# Patient Record
Sex: Male | Born: 1985
Health system: Southern US, Community
[De-identification: ages and names within clinical notes are randomized; demographics above are authoritative.]

## PROBLEM LIST (undated history)

## (undated) DIAGNOSIS — M459 Ankylosing spondylitis of unspecified sites in spine: Secondary | ICD-10-CM

## (undated) HISTORY — DX: Ankylosing spondylitis of unspecified sites in spine: M45.9

---

## 2012-03-02 DIAGNOSIS — M459 Ankylosing spondylitis of unspecified sites in spine: Secondary | ICD-10-CM | POA: Insufficient documentation

## 2012-10-04 DIAGNOSIS — Z79899 Other long term (current) drug therapy: Secondary | ICD-10-CM | POA: Insufficient documentation

## 2015-06-06 ENCOUNTER — Encounter: Payer: Self-pay | Admitting: Family Medicine

## 2015-06-06 ENCOUNTER — Ambulatory Visit (INDEPENDENT_AMBULATORY_CARE_PROVIDER_SITE_OTHER): Payer: BLUE CROSS/BLUE SHIELD | Admitting: Family Medicine

## 2015-06-06 VITALS — BP 135/83 | HR 96 | Ht 70.0 in | Wt 293.0 lb

## 2015-06-06 DIAGNOSIS — K611 Rectal abscess: Secondary | ICD-10-CM

## 2015-06-06 DIAGNOSIS — F411 Generalized anxiety disorder: Secondary | ICD-10-CM

## 2015-06-06 DIAGNOSIS — E559 Vitamin D deficiency, unspecified: Secondary | ICD-10-CM

## 2015-06-06 DIAGNOSIS — F331 Major depressive disorder, recurrent, moderate: Secondary | ICD-10-CM

## 2015-06-06 DIAGNOSIS — E669 Obesity, unspecified: Secondary | ICD-10-CM

## 2015-06-06 DIAGNOSIS — M459 Ankylosing spondylitis of unspecified sites in spine: Secondary | ICD-10-CM

## 2015-06-06 DIAGNOSIS — F329 Major depressive disorder, single episode, unspecified: Secondary | ICD-10-CM | POA: Insufficient documentation

## 2015-06-06 MED ORDER — DOXYCYCLINE HYCLATE 100 MG PO TABS: 100.0000 mg | ORAL_TABLET | Freq: Two times a day (BID) | ORAL | Status: DC

## 2015-06-06 MED ORDER — SERTRALINE HCL 25 MG PO TABS: 25.0000 mg | ORAL_TABLET | Freq: Every day | ORAL | Status: DC

## 2015-06-06 MED ORDER — BUPROPION HCL ER (XL) 150 MG PO TB24
150.0000 mg | ORAL_TABLET | Freq: Every day | ORAL | Status: DC
Start: 2015-06-06 — End: 2015-06-30

## 2015-06-06 NOTE — Progress Notes (Signed)
Brandon Kelley is a 30 y.o. male who [name] Care today for gluteal cleft cyst, anxiety and depression, smoking cessation and obesity.  Gluteal cleft cyst: Started several weeks ago. Progressively more painful until 2 days ago when it burst and drained yellow/green fluid. No longer hurts. Has had cyst in superior gluteal cleft in the past that was popped by his girlfriend. He has soaked in hot water bath and used peroxide, but is concerned about rick of infection given proximity to feces.  Anxiety and Depression: Has struggled with feeling anxious in the past as well as depressed mood, though never formally diagnosed. He complains of decreased energy, loss of sleep, lack of appetite, decreased interest, denies SI, worrying on daily basis that also interferes with sleep. He went to a hypnotherapist last year around this time but did not feel this helped.Took his moms "SSRI" called "alprazolam" for one week in the past which "turned down some of the anxiety" but interfered with ejaculation. He is hesitant to trial another SSRI and not interested in CBT as he "does his own HW at home to deal with this". He wants to handle this so he has energy for the impending birth of his first child.   Smoking cessation: Has tried once last year with patches and was able to abstain for 3 months. He was quitting with his mom who failed which lead to him smoking cigarettes again. This is the priority change he would like to make when compared with weight loss. He has tried nicotine gum in the past but did not like it.   Ankylosing spondylitis: tolerating medicine well. Occasional flare of low back pain described as "kidney pain" given past kidney stones. Seen by rheumatology in [place].    Past Medical History  Diagnosis Date  . Ankylosing spondylitis (HCC)    History reviewed. No pertinent past  surgical history. Social History  Substance Use Topics  . Smoking status: Never Smoker   . Smokeless tobacco: Not on file  . Alcohol Use: 0.0 oz/week    0 Standard drinks or equivalent per week   family history includes Cancer in his father; Diabetes in his paternal grandmother; Hypertension in his mother.  ROS as above Medications: Current Outpatient Prescriptions  Medication Sig Dispense Refill  . etanercept (ENBREL SURECLICK) 50 MG/ML injection Inject 50 mg into the skin.    Marland Kitchen. buPROPion (WELLBUTRIN XL) 150 MG 24 hr tablet Take 1 tablet (150 mg total) by mouth daily. 30 tablet 0  . doxycycline (VIBRA-TABS) 100 MG tablet Take 1 tablet (100 mg total) by mouth 2 (two) times daily. 14 tablet 0  . sertraline (ZOLOFT) 25 MG tablet Take 1 tablet (25 mg total) by mouth daily. 30 tablet 0   No current facility-administered medications for this visit.   Allergies  Allergen Reactions  . Penicillins      Exam:  BP 135/83 mmHg  Pulse 96  Ht 5\' 10"  (1.778 m)  Wt 293 lb (132.904 kg)  BMI 42.04 kg/m2 Gen: Well appearing gentleman in NAD HEENT: EOMI,  MMM  Lungs: Normal work of breathing. CTABL no wheezes or crackles.  Heart: RRR no MRG Abd: NABS, Soft. Nondistended, Nontender Exts: Brisk capillary refill, warm and well perfused.  Buttocks: 0.5 cm erythematous lesion expressing yellow viscous fluid on left gluteal fold 0.5cm lateral to cleft, 2 cm superior to the anus. Psych: Alert and oriented flattened affect. Normal Speech. No SI or  HI.  No results found for this or any previous visit (from the past 24 hour(s)). No results found.  PHQ9: 8 GAD7: 5  Please see individual assessment and plan sections.

## 2015-06-06 NOTE — Assessment & Plan Note (Signed)
Discussed deferring formal weight loss plan until after quitting smoking. Suggested myfitnesspal for calorie counting and counseled on exercise options. Will follow up in future.

## 2015-06-06 NOTE — Patient Instructions (Signed)
Thank you for coming in today. You were seen for abscess, anxiety and depression, smoking cessation and obesity.  For the abscess, keep clean with wet wipes or wand bidet as well as warm baths. We will prescribe antibiotics to use if the pain returns, then call in for an appointment to see us.  For the anxiety and depression, we will start sertraline and bupropion. The bupropion will help with smoking cravings and sexual side effects. We will get screening labs as well. For the smoking cessation, we recommend 2 weeks of the 14 mg nicotine patch followed by 2 weeks of the 7 mg patch, followed by quitting.  Please return in 1-2 weeks to discuss lab results and see how the medications are working.

## 2015-06-06 NOTE — Assessment & Plan Note (Signed)
Mild. Prescribed sertraline. Will follow up in 1-2 weeks.

## 2015-06-06 NOTE — Assessment & Plan Note (Signed)
Likely given location and time course, release with popping. This is second abscess in this area, though past abscess seems like a pilonidal cyst. Considering possibility of fistulae given ankylosing spondylitis, though no prominent GI symptoms at this point.

## 2015-06-06 NOTE — Assessment & Plan Note (Signed)
Mild, though patients description of symptoms seen more in moderate range. Will start sertraline and bupropion given sexual side effects in past and smoking cessation need.

## 2015-06-06 NOTE — Assessment & Plan Note (Signed)
Chronic, stable. Continue follow up with Rheumatology.

## 2015-06-17 ENCOUNTER — Ambulatory Visit (INDEPENDENT_AMBULATORY_CARE_PROVIDER_SITE_OTHER): Payer: BLUE CROSS/BLUE SHIELD | Admitting: Family Medicine

## 2015-06-17 ENCOUNTER — Encounter: Payer: Self-pay | Admitting: Family Medicine

## 2015-06-17 VITALS — BP 118/82 | HR 75 | Wt 287.0 lb

## 2015-06-17 DIAGNOSIS — F411 Generalized anxiety disorder: Secondary | ICD-10-CM | POA: Diagnosis not present

## 2015-06-17 DIAGNOSIS — F331 Major depressive disorder, recurrent, moderate: Secondary | ICD-10-CM | POA: Diagnosis not present

## 2015-06-17 MED ORDER — SERTRALINE HCL 50 MG PO TABS: 50.0000 mg | ORAL_TABLET | Freq: Every day | ORAL | Status: DC

## 2015-06-17 NOTE — Assessment & Plan Note (Signed)
Increase Zoloft to 50 mg. Self titrate 200 mg in a few weeks. Recheck 1 month

## 2015-06-17 NOTE — Patient Instructions (Signed)
Thank you for coming in today. Return in 1 month.  Increase zoloft to 50 mg.  After 2 weeks increase to 100mg .

## 2015-06-17 NOTE — Progress Notes (Signed)
       Brandon Kelley is a 30 y.o. male who [name] Primary Care today for follow-up anxiety depression. Patient was seen a few weeks ago and started on bupropion and sertraline. He tolerates his medicine well with no side effects. He denies any SI or HI.   Past Medical History  Diagnosis Date  . Ankylosing spondylitis (HCC)    No past surgical history on file. Social History  Substance Use Topics  . Smoking status: Never Smoker   . Smokeless tobacco: Not on file  . Alcohol Use: 0.0 oz/week    0 Standard drinks or equivalent per week   family history includes Cancer in his father; Diabetes in his paternal grandmother; Hypertension in his mother.  ROS as above Medications: Current Outpatient Prescriptions  Medication Sig Dispense Refill  . buPROPion (WELLBUTRIN XL) 150 MG 24 hr tablet Take 1 tablet (150 mg total) by mouth daily. 30 tablet 0  . etanercept (ENBREL SURECLICK) 50 MG/ML injection Inject 50 mg into the skin.    Marland Kitchen. sertraline (ZOLOFT) 50 MG tablet Take 1 tablet (50 mg total) by mouth daily. 30 tablet 0   No current facility-administered medications for this visit.   Allergies  Allergen Reactions  . Penicillins      Exam:  BP 118/82 mmHg  Pulse 75  Wt 287 lb (130.182 kg) Gen: Well NAD  Psych: Slightly flat affect. No SI or HI. Normal speech and thought processes. PHQ9 is 3, GAD 7 is 2  No results found for this or any previous visit (from the past 24 hour(s)). No results found.   Please see individual assessment and plan sections.

## 2015-06-18 DIAGNOSIS — E559 Vitamin D deficiency, unspecified: Secondary | ICD-10-CM | POA: Insufficient documentation

## 2015-06-18 LAB — LIPID PANEL
Cholesterol: 154 mg/dL (ref 125–200)
HDL: 30 mg/dL — ABNORMAL LOW (ref >=40)
LDL CALC: 105 mg/dL (ref <130)
TRIGLYCERIDES: 97 mg/dL (ref <150)
Total CHOL/HDL Ratio: 5.1 Ratio — ABNORMAL HIGH (ref <=5.0)
VLDL: 19 mg/dL (ref <30)

## 2015-06-18 LAB — CBC
HEMATOCRIT: 49.3 % (ref 39.0–52.0)
HEMOGLOBIN: 16.9 g/dL (ref 13.0–17.0)
MCH: 29.2 pg (ref 26.0–34.0)
MCHC: 34.3 g/dL (ref 30.0–36.0)
MCV: 85.1 fL (ref 78.0–100.0)
MPV: 10 fL (ref 8.6–12.4)
Platelets: 343 10*3/uL (ref 150–400)
RBC: 5.79 MIL/uL (ref 4.22–5.81)
RDW: 13.6 % (ref 11.5–15.5)
WBC: 12.4 10*3/uL — AB (ref 4.0–10.5)

## 2015-06-18 LAB — TSH: TSH: 2.535 u[IU]/mL (ref 0.350–4.500)

## 2015-06-18 LAB — COMPREHENSIVE METABOLIC PANEL
ALT: 24 U/L (ref 9–46)
AST: 22 U/L (ref 10–40)
Albumin: 4.2 g/dL (ref 3.6–5.1)
Alkaline Phosphatase: 128 U/L — ABNORMAL HIGH (ref 40–115)
BUN: 9 mg/dL (ref 7–25)
CHLORIDE: 103 mmol/L (ref 98–110)
CO2: 26 mmol/L (ref 20–31)
CREATININE: 0.83 mg/dL (ref 0.60–1.35)
Calcium: 9.4 mg/dL (ref 8.6–10.3)
GLUCOSE: 91 mg/dL (ref 65–99)
Potassium: 4 mmol/L (ref 3.5–5.3)
SODIUM: 139 mmol/L (ref 135–146)
Total Bilirubin: 1.5 mg/dL — ABNORMAL HIGH (ref 0.2–1.2)
Total Protein: 7.1 g/dL (ref 6.1–8.1)

## 2015-06-18 LAB — VITAMIN D 25 HYDROXY (VIT D DEFICIENCY, FRACTURES): VIT D 25 HYDROXY: 7 ng/mL — AB (ref 30–100)

## 2015-06-18 MED ORDER — VITAMIN D (ERGOCALCIFEROL) 1.25 MG (50000 UNIT) PO CAPS: 50000.0000 [IU] | ORAL_CAPSULE | ORAL | Status: DC

## 2015-06-18 NOTE — Progress Notes (Signed)
Quick Note:  Vitamin D deficiency noted.  Rx for Vit D called in.   ______

## 2015-06-18 NOTE — Addendum Note (Signed)
Addended by: Rodolph Bong on: 06/18/2015 07:50 AM   Modules accepted: Orders

## 2015-06-30 ENCOUNTER — Other Ambulatory Visit: Payer: Self-pay | Admitting: Family Medicine

## 2015-07-10 ENCOUNTER — Other Ambulatory Visit: Payer: Self-pay | Admitting: Family Medicine

## 2015-07-15 ENCOUNTER — Encounter: Payer: Self-pay | Admitting: Family Medicine

## 2015-07-15 ENCOUNTER — Ambulatory Visit (INDEPENDENT_AMBULATORY_CARE_PROVIDER_SITE_OTHER): Payer: BLUE CROSS/BLUE SHIELD | Admitting: Family Medicine

## 2015-07-15 VITALS — BP 125/74 | HR 91 | Wt 281.0 lb

## 2015-07-15 DIAGNOSIS — F331 Major depressive disorder, recurrent, moderate: Secondary | ICD-10-CM | POA: Diagnosis not present

## 2015-07-15 DIAGNOSIS — H9209 Otalgia, unspecified ear: Secondary | ICD-10-CM | POA: Insufficient documentation

## 2015-07-15 DIAGNOSIS — H9202 Otalgia, left ear: Secondary | ICD-10-CM

## 2015-07-15 MED ORDER — AZITHROMYCIN 250 MG PO TABS: 250.0000 mg | ORAL_TABLET | Freq: Every day | ORAL | Status: DC

## 2015-07-15 MED ORDER — PREDNISONE 10 MG PO TABS: 30.0000 mg | ORAL_TABLET | Freq: Every day | ORAL | Status: DC

## 2015-07-15 MED ORDER — SERTRALINE HCL 100 MG PO TABS: 100.0000 mg | ORAL_TABLET | Freq: Every day | ORAL | Status: DC

## 2015-07-15 MED ORDER — BUPROPION HCL ER (XL) 150 MG PO TB24: 150.0000 mg | ORAL_TABLET | Freq: Every day | ORAL | Status: DC

## 2015-07-15 NOTE — Patient Instructions (Signed)
Thank you for coming in today. Continue the zoloft and welburtin.  Take azithromycin for ear infection. Use prednisone as needed.  Return in 3 months.  Call or go to the emergency room if you get worse, have trouble breathing, have chest pains, or palpitations.

## 2015-07-16 NOTE — Assessment & Plan Note (Signed)
Doing well. Continue Zoloft 100 mg daily and Wellbutrin 150 XR daily. Return in 3 months.

## 2015-07-16 NOTE — Progress Notes (Signed)
       Brandon Kelley is a 30 y.o. male who presents to [name]: Primary Care today for follow-up of depression and anxiety. Patient has been taking 100 mg of sertraline daily and 150 mg of bupropion XL daily. He feels well and is essentially asymptomatic. He is quite pleased with how things are going. He notes he is also reduced smoking with Wellbutrin.   Past Medical History  Diagnosis Date  . Ankylosing spondylitis (HCC)    No past surgical history on file. Social History  Substance Use Topics  . Smoking status: Never Smoker   . Smokeless tobacco: Not on file  . Alcohol Use: 0.0 oz/week    0 Standard drinks or equivalent per week   family history includes Cancer in his father; Diabetes in his paternal grandmother; Hypertension in his mother.  ROS as above Medications: Current Outpatient Prescriptions  Medication Sig Dispense Refill  . buPROPion (WELLBUTRIN XL) 150 MG 24 hr tablet Take 1 tablet (150 mg total) by mouth daily. 90 tablet 1  . etanercept (ENBREL SURECLICK) 50 MG/ML injection Inject 50 mg into the skin.    . Vitamin D, Ergocalciferol, (DRISDOL) 50000 units CAPS capsule Take 1 capsule (50,000 Units total) by mouth every 7 (seven) days. Take for 8 total doses(weeks) 8 capsule 0  . azithromycin (ZITHROMAX) 250 MG tablet Take 1 tablet (250 mg total) by mouth daily. Take first 2 tablets together, then 1 every day until finished. 6 tablet 0  . predniSONE (DELTASONE) 10 MG tablet Take 3 tablets (30 mg total) by mouth daily. 15 tablet 0  . sertraline (ZOLOFT) 100 MG tablet Take 1 tablet (100 mg total) by mouth daily. 90 tablet 1   No current facility-administered medications for this visit.   Allergies  Allergen Reactions  . Penicillins      Exam:  BP 125/74 mmHg  Pulse 91  Wt 281 lb (127.461 kg) Gen: Well NAD Psych: Alert and oriented normal speech thought process and  affect.  PHQ9 is 1.  GAD 7 is 0.  No results found for this or any previous visit (from the past 24 hour(s)). No results found.   Please see individual assessment and plan sections.

## 2015-10-14 ENCOUNTER — Encounter: Payer: Self-pay | Admitting: Family Medicine

## 2015-10-14 ENCOUNTER — Ambulatory Visit (INDEPENDENT_AMBULATORY_CARE_PROVIDER_SITE_OTHER): Payer: BLUE CROSS/BLUE SHIELD | Admitting: Family Medicine

## 2015-10-14 VITALS — BP 119/83 | HR 85 | Wt 290.0 lb

## 2015-10-14 DIAGNOSIS — R251 Tremor, unspecified: Secondary | ICD-10-CM

## 2015-10-14 NOTE — Patient Instructions (Signed)
Thank you for coming in today. Take 5000 units of vit D daily.  Return in 6 months.  Reduce caffeine. This may help your tremor.   Essential Tremor A tremor is trembling or shaking that you cannot control. Most tremors affect the hands or arms. Tremors can also affect the head, vocal cords, face, and other parts of the body.  Essential tremor is a tremor without a known cause.  CAUSES Essential tremor has no known cause.  RISK FACTORS You may be at greater risk of essential tremor if:   You have a family member with essential tremor.   You are age 29 or older.   You take certain medicines. SIGNS AND SYMPTOMS The main sign of a tremor is uncontrolled and unintentional rhythmic shaking of a body part.  You may have difficulty eating with a spoon or fork.   You may have difficulty writing.   You may nod your head up and down or side to side.   You may have a quivering voice.  Your tremors:  May get worse over time.   May come and go.   May be more noticeable on one side of your body.   May get worse due to stress, fatigue, caffeine, and extreme heat or cold.  DIAGNOSIS Your health care provider can diagnose essential tremor based on your symptoms, medical history, and a physical examination. There is no single test to diagnose an essential tremor. However, your health care provider may perform a variety of tests to rule out other conditions. Tests may include:   Blood and urine tests.   Imaging studies of your brain, such as:   CT scan.   MRI.   A test that measures involuntary muscle movement (electromyogram). TREATMENT Your tremors may go away without treatment. Mild tremors may not need treatment if they do not affect your day-to-day life. Severe tremors may need to be treated using one or a combination of the following options:   Medicines. This may include medicine that is injected.  Lifestyle changes.   Physical therapy.  HOME CARE  INSTRUCTIONS  Take medicines only as directed by your health care provider.   Limit alcohol intake to no more than 1 drink per day for nonpregnant women and 2 drinks per day for men. One drink equals 12 oz of beer, 5 oz of wine, or 1 oz of hard liquor.  Do not use any tobacco products, including cigarettes, chewing tobacco, or electronic cigarettes. If you need help quitting, ask your health care provider.  Take medicines only as directed by your health care provider.   Avoid extreme heat or cold.   Limit the amount of caffeine you consumeas directed by your health care provider.   Try to get eight hours of sleep each night.  Find ways to manage your stress, such as meditation or yoga.  Keep all follow-up visits as directed by your health care provider. This is important. This includes any physical therapy visits. SEEK MEDICAL CARE IF:  You experience any changes in the location or intensity of your tremors.   You start having a tremor after starting a new medicine.   You have tremor with other symptoms such as:   Numbness.   Tingling.   Pain.   Weakness.   Your tremor gets worse.   Your tremor interferes with your daily life.    This information is not intended to replace advice given to you by your health care provider. Make sure you discuss  any questions you have with your health care provider.   Document Released: 06/08/2014 Document Reviewed: 06/08/2014 Elsevier Interactive Patient Education [employer]2016 [employer] Inc.

## 2015-10-14 NOTE — Progress Notes (Signed)
       Brandon RileDavid Kelley is a 30 y.o. male who [name] Primary Care today for follow-up depression and anxiety. Patient has been doing quite well with sertraline and bupropion. He denies any significantly abnormal mood. He does note occasional fine tremor with fine motor tasks. This is never caused a problem. It's not present at rest. He denies any family history of tremor. He notes he reduced his caffeine intake which seemed to help some.  Additionally patient notes that he no longer is taking vitamin D at all   Past Medical History  Diagnosis Date  . Ankylosing spondylitis (HCC)    No past surgical history on file. Social History  Substance Use Topics  . Smoking status: Never Smoker   . Smokeless tobacco: Not on file  . Alcohol Use: 0.0 oz/week    0 Standard drinks or equivalent per week   family history includes Cancer in his father; Diabetes in his paternal grandmother; Hypertension in his mother.  ROS as above Medications: Current Outpatient Prescriptions  Medication Sig Dispense Refill  . buPROPion (WELLBUTRIN XL) 150 MG 24 hr tablet Take 1 tablet (150 mg total) by mouth daily. 90 tablet 1  . etanercept (ENBREL SURECLICK) 50 MG/ML injection Inject 50 mg into the skin.    Marland Kitchen. sertraline (ZOLOFT) 100 MG tablet Take 1 tablet (100 mg total) by mouth daily. 90 tablet 1  . Vitamin D, Ergocalciferol, (DRISDOL) 50000 units CAPS capsule Take 1 capsule (50,000 Units total) by mouth every 7 (seven) days. Take for 8 total doses(weeks) 8 capsule 0   No current facility-administered medications for this visit.   Allergies  Allergen Reactions  . Penicillins      Exam:  BP 119/83 mmHg  Pulse 85  Wt 290 lb (131.543 kg) Gen: Well NAD HEENT: EOMI,  MMM Lungs: Normal work of breathing. CTABL Heart: RRR no MRG Abd: NABS, Soft. Nondistended, Nontender Exts: Brisk capillary refill, warm and  well perfused.  Neuro: No tremor noted normal coordination and strength. Psych alert and oriented with normal affect. GAD 7 is 1 PHQ9 is 2.  No results found for this or any previous visit (from the past 24 hour(s)). No results found.   30 year old male with  1) depression/anxiety: Doing well continue current regimen.  2) tremor: Unclear etiology plan for watchful waiting and decrease caffeine content. Still present will consider referral to neurology.  3) vitamin D deficiency: Recommend over-the-counter vitamin D daily.   Recheck in about 6 months.

## 2016-02-14 ENCOUNTER — Other Ambulatory Visit: Payer: Self-pay | Admitting: Family Medicine

## 2016-02-15 ENCOUNTER — Other Ambulatory Visit: Payer: Self-pay | Admitting: Family Medicine

## 2016-04-19 ENCOUNTER — Encounter: Payer: Self-pay | Admitting: Emergency Medicine

## 2016-04-19 ENCOUNTER — Emergency Department
Admission: EM | Admit: 2016-04-19 | Discharge: 2016-04-19 | Disposition: A | Discharge status: Home / Self Care | Payer: BLUE CROSS/BLUE SHIELD | Source: Home / Self Care | Attending: Family Medicine | Admitting: Family Medicine

## 2016-04-19 DIAGNOSIS — S0502XA Injury of conjunctiva and corneal abrasion without foreign body, left eye, initial encounter: Secondary | ICD-10-CM

## 2016-04-19 MED ORDER — POLYMYXIN B-TRIMETHOPRIM 10000-0.1 UNIT/ML-% OP SOLN: 1.0000 [drp] | OPHTHALMIC | 0 refills | Status: DC

## 2016-04-19 MED ORDER — IBUPROFEN 600 MG PO TABS: 600.0000 mg | ORAL_TABLET | Freq: Four times a day (QID) | ORAL | 0 refills | Status: DC | PRN

## 2016-04-19 MED ORDER — HYDROCODONE-ACETAMINOPHEN 5-325 MG PO TABS: 1.0000 | ORAL_TABLET | Freq: Four times a day (QID) | ORAL | 0 refills | Status: DC | PRN

## 2016-04-19 NOTE — Discharge Instructions (Signed)
°  Norco/Vicodin (hydrocodone-acetaminophen) is a narcotic pain medication, do not combine these medications with others containing tylenol. While taking, do not drink alcohol, drive, or perform any other activities that requires focus while taking these medications.  ° °

## 2016-04-19 NOTE — ED Provider Notes (Signed)
CSN: [id number]     Arrival date & time 04/19/16  1550 History   First MD Initiated Contact with Patient 04/19/16 1606     Chief Complaint  Patient presents with  . Eye Injury   (Consider location/radiation/quality/duration/timing/severity/associated sxs/prior Treatment) HPI Brandon Kelley is a 30 y.o. male presenting to UC with c/o Left eye pain that started earlier this morning.  Pt states a USB cord feel off a shelf and hit him in his eye.  He is c/o pain and swelling in his Left eye. He has applied ice with mild relief.  He does not wear contacts or glasses.  Pain is aching and sore, 6/10.  No other injuries.    Past Medical History:  Diagnosis Date  . Ankylosing spondylitis (HCC)    History reviewed. No pertinent surgical history. Family History  Problem Relation Age of Onset  . Hypertension Mother   . Cancer Father   . Diabetes Paternal Grandmother    Social History  Substance Use Topics  . Smoking status: Never Smoker  . Smokeless tobacco: Current User  . Alcohol use 0.0 oz/week    Review of Systems  Eyes: Positive for photophobia, pain, discharge (watery), redness and visual disturbance. Negative for itching.  Skin: Negative for color change and wound.    Allergies  Penicillins  Home Medications   Prior to Admission medications   Medication Sig Start Date End Date Taking? Authorizing Provider  buPROPion (WELLBUTRIN XL) 150 MG 24 hr tablet TAKE 1 TABLET(150 MG) BY MOUTH DAILY 02/17/16   Rodolph BongEvan S Corey, MD  buPROPion (WELLBUTRIN XL) 150 MG 24 hr tablet TAKE 1 TABLET(150 MG) BY MOUTH DAILY 02/17/16   Rodolph BongEvan S Corey, MD  etanercept (ENBREL SURECLICK) 50 MG/ML injection Inject 50 mg into the skin. 12/18/13   Historical Provider, MD  HYDROcodone-acetaminophen (NORCO/VICODIN) 5-325 MG tablet Take 1 tablet by mouth every 6 (six) hours as needed. 04/19/16   Junius FinnerErin O'Malley, PA-C  ibuprofen (ADVIL,MOTRIN) 600 MG tablet Take 1 tablet (600 mg total) by mouth every 6 (six) hours as  needed. 04/19/16   Junius FinnerErin O'Malley, PA-C  sertraline (ZOLOFT) 100 MG tablet TAKE 1 TABLET(100 MG) BY MOUTH DAILY 02/17/16   Rodolph BongEvan S Corey, MD  sertraline (ZOLOFT) 100 MG tablet TAKE 1 TABLET(100 MG) BY MOUTH DAILY 02/17/16   Rodolph BongEvan S Corey, MD  trimethoprim-polymyxin b (POLYTRIM) ophthalmic solution Place 1 drop into the left eye every 4 (four) hours. For 5 days 04/19/16   Junius FinnerErin O'Malley, PA-C   Meds Ordered and Administered this Visit  Medications - No data to display  BP 126/77 (BP Location: Right Arm)   Pulse 88   Temp 97.6 F (36.4 C) (Oral)   Wt (!) 304 lb (137.9 kg)   SpO2 98%   BMI 43.62 kg/m  No data found.   Physical Exam  Constitutional: He is oriented to person, place, and time. He appears well-developed and well-nourished.  HENT:  Head: Normocephalic and atraumatic.  Eyes: EOM and lids are normal. Lids are everted and swept, no foreign bodies found. Left conjunctiva is injected.  Left eye: small amount of fluorescein uptake. No foreign bodies seen on exam.   Neck: Normal range of motion.  Cardiovascular: Normal rate.   Pulmonary/Chest: Effort normal.  Musculoskeletal: Normal range of motion.  Neurological: He is alert and oriented to person, place, and time.  Skin: Skin is warm and dry.  Psychiatric: He has a normal mood and affect. His behavior is normal.  Nursing note and  vitals reviewed.   Urgent Care Course   Clinical Course     Procedures (including critical care time)  Labs Review Labs Reviewed - No data to display  Imaging Review No results found.   Visual Acuity Review  Right Eye Distance: 20/25 Left Eye Distance: 20/40 Bilateral Distance: 20/20   MDM   1. Abrasion of left cornea, initial encounter    Pt c/o Left eye pain after being his with USB cord this morning.  Corneal abrasion noted on exam. Rx: norco, ibuprofen, and polytrim ophthalmic solution. Pt has previously established f/u with PCP tomorrow, 11/20. Encouraged f/u as scheduled.      Junius Finnerrin O'Malley, PA-C 04/20/16 1016

## 2016-04-19 NOTE — ED Triage Notes (Signed)
Pt states a USB cord fell of a shelf and hit his left eye this am. C/o pain and swelling. He does not wear contacts or glasses.

## 2016-04-20 ENCOUNTER — Encounter: Payer: Self-pay | Admitting: Family Medicine

## 2016-04-20 ENCOUNTER — Ambulatory Visit (INDEPENDENT_AMBULATORY_CARE_PROVIDER_SITE_OTHER): Payer: BLUE CROSS/BLUE SHIELD | Admitting: Family Medicine

## 2016-04-20 VITALS — BP 132/82 | HR 91 | Temp 98.0°F | Wt 306.0 lb

## 2016-04-20 DIAGNOSIS — S0502XA Injury of conjunctiva and corneal abrasion without foreign body, left eye, initial encounter: Secondary | ICD-10-CM

## 2016-04-20 DIAGNOSIS — F331 Major depressive disorder, recurrent, moderate: Secondary | ICD-10-CM

## 2016-04-20 DIAGNOSIS — J300 Vasomotor rhinitis: Secondary | ICD-10-CM | POA: Diagnosis not present

## 2016-04-20 MED ORDER — SERTRALINE HCL 100 MG PO TABS: ORAL_TABLET | ORAL | 3 refills | Status: DC

## 2016-04-20 MED ORDER — IPRATROPIUM BROMIDE 0.06 % NA SOLN: 2.0000 | NASAL | 6 refills | Status: DC | PRN

## 2016-04-20 NOTE — Patient Instructions (Signed)
Thank you for coming in today. Use atrovent spray as needed.  Continue zoloft.  Recheck in 6-12 months or sooner if needed.

## 2016-04-20 NOTE — Progress Notes (Signed)
Brandon Kelley is a 30 y.o. male who [name] today for follow-up depression, corneal abrasion, runny nose.  Depression: Doing well with Zoloft. Patient tolerates the medications well with minimal side effects. He would like to continue taking the medication.  Corneal abrasion: Patient was recently seen in urgent care for left eye corneal abrasion. He notes is feeling much better but continues to experience some mild eye pain and photophobia. He does note continuously watering left eye and running left nose.   Past Medical History:  Diagnosis Date  . Ankylosing spondylitis (HCC)    No past surgical history on file. Social History  Substance Use Topics  . Smoking status: Never Smoker  . Smokeless tobacco: Current User  . Alcohol use 0.0 oz/week   family history includes Cancer in his father; Diabetes in his paternal grandmother; Hypertension in his mother.  ROS as above:  Medications: Current Outpatient Prescriptions  Medication Sig Dispense Refill  . buPROPion (WELLBUTRIN XL) 150 MG 24 hr tablet TAKE 1 TABLET(150 MG) BY MOUTH DAILY 90 tablet 0  . etanercept (ENBREL SURECLICK) 50 MG/ML injection Inject 50 mg into the skin.    Marland Kitchen. HYDROcodone-acetaminophen (NORCO/VICODIN) 5-325 MG tablet Take 1 tablet by mouth every 6 (six) hours as needed. 8 tablet 0  . ibuprofen (ADVIL,MOTRIN) 600 MG tablet Take 1 tablet (600 mg total) by mouth every 6 (six) hours as needed. 30 tablet 0  . sertraline (ZOLOFT) 100 MG tablet TAKE 1 TABLET(100 MG) BY MOUTH DAILY 90 tablet 3  . trimethoprim-polymyxin b (POLYTRIM) ophthalmic solution Place 1 drop into the left eye every 4 (four) hours. For 5 days 10 mL 0  . ipratropium (ATROVENT) 0.06 % nasal spray Place 2 sprays into both nostrils every 4 (four) hours as needed for rhinitis. 10 mL 6   No current facility-administered  medications for this visit.    Allergies  Allergen Reactions  . Penicillins     Health Maintenance Health Maintenance  Topic Date Due  . HIV Screening  11/11/2001  . INFLUENZA VACCINE  06/05/2016 (Originally 12/31/2015)  . TETANUS/TDAP  06/05/2016 (Originally 11/11/2005)     Exam:  BP 132/82   Pulse 91   Temp 98 F (36.7 C) (Oral)   Wt (!) 306 lb (138.8 kg)   SpO2 97%   BMI 43.91 kg/m  Gen: Well NAD HEENT: EOMI,  MMM Left eye conjunctiva injection with clear discharge. Left nose has clear nasal discharge and mildly injected nasal turbinates. Lungs: Normal work of breathing. CTABL Heart: RRR no MRG Abd: NABS, Soft. Nondistended, Nontender Exts: Brisk capillary refill, warm and well perfused.  Depression screen PHQ 2/9 04/20/2016  Decreased Interest 0  Down, Depressed, Hopeless 1  PHQ - 2 Score 1  Altered sleeping 0  Tired, decreased energy 2  Change in appetite 1  Feeling bad or failure about yourself  1  Trouble concentrating 0  Moving slowly or fidgety/restless 0  Suicidal thoughts 0  PHQ-9 Score 5  Difficult doing work/chores Not difficult at all     No results found for this or any previous visit (from the past 72 hour(s)). No results found.    Assessment and Plan: 30 y.o. male with  Depression: Doing well continue current management recheck in 6-12 months.  Corneal abrasion: Doing well continue current management. Return sooner if worsening.  Runny nose likely vasomotor rhinitis due to corneal abrasion. Use Atrovent nasal spray.  No orders of the defined types were placed in this encounter.   Discussed warning signs or symptoms. Please see discharge instructions. Patient expresses understanding.

## 2016-12-21 ENCOUNTER — Ambulatory Visit: Payer: BLUE CROSS/BLUE SHIELD | Admitting: Family Medicine

## 2016-12-21 DIAGNOSIS — Z0189 Encounter for other specified special examinations: Secondary | ICD-10-CM

## 2017-05-03 ENCOUNTER — Ambulatory Visit (INDEPENDENT_AMBULATORY_CARE_PROVIDER_SITE_OTHER): Payer: BLUE CROSS/BLUE SHIELD

## 2017-05-03 ENCOUNTER — Encounter: Payer: Self-pay | Admitting: Sports Medicine

## 2017-05-03 ENCOUNTER — Ambulatory Visit (INDEPENDENT_AMBULATORY_CARE_PROVIDER_SITE_OTHER): Payer: BLUE CROSS/BLUE SHIELD | Admitting: Sports Medicine

## 2017-05-03 DIAGNOSIS — R0989 Other specified symptoms and signs involving the circulatory and respiratory systems: Secondary | ICD-10-CM | POA: Diagnosis not present

## 2017-05-03 DIAGNOSIS — J209 Acute bronchitis, unspecified: Secondary | ICD-10-CM

## 2017-05-03 DIAGNOSIS — R05 Cough: Secondary | ICD-10-CM | POA: Diagnosis not present

## 2017-05-03 MED ORDER — AZITHROMYCIN 250 MG PO TABS: ORAL_TABLET | ORAL | 0 refills | Status: DC

## 2017-05-03 MED ORDER — PREDNISONE 50 MG PO TABS: ORAL_TABLET | ORAL | 0 refills | Status: DC

## 2017-05-03 NOTE — Assessment & Plan Note (Signed)
Is now present for a week. He is relatively immunocompromised with ankylosing spondylitis and on Etanercept Because of this we are going to treat him aggressively and keep close follow-up. Azithromycin, prednisone, chest x-ray. May continue to use over-the-counter cough and cold medications. Return if not feeling great by the end of the week.

## 2017-05-03 NOTE — Progress Notes (Signed)
  Subjective:    CC: Feeling sick  HPI: This is a pleasant 31 year old male, he has a history of ankylosing spondylitis, currently on immunosuppressants.  For the past 7 days he has had cough, sore throat, muscle aches, body aches, fevers and chills.  Cough is productive of mucus, he has some facial and sinus pain and pressure as well, no nausea, vomiting, diarrhea, no skin rashes, symptoms are moderate, persistent.  Past medical history:  Negative.  See flowsheet/record as well for more information.  Surgical history: Negative.  See flowsheet/record as well for more information.  Family history: Negative.  See flowsheet/record as well for more information.  Social history: Negative.  See flowsheet/record as well for more information.  Allergies, and medications have been entered into the medical record, reviewed, and no changes needed.   Review of Systems: No fevers, chills, night sweats, weight loss, chest pain, or shortness of breath.   Objective:    General: Well Developed, well nourished, and in no acute distress.  Neuro: Alert and oriented x3, extra-ocular muscles intact, sensation grossly intact.  HEENT: Normocephalic, atraumatic, pupils equal round reactive to light, neck supple, no masses, no lymphadenopathy, thyroid nonpalpable.  Oropharynx, nasopharynx, ear canals unremarkable, no tenderness over the sinuses Skin: Warm and dry, no rashes. Cardiac: Regular rate and rhythm, no murmurs rubs or gallops, no lower extremity edema.  Respiratory: Clear to auscultation bilaterally. Not using accessory muscles, speaking in full sentences.  Impression and Recommendations:    Acute bronchitis Is now present for a week. He is relatively immunocompromised with ankylosing spondylitis and on Etanercept Because of this we are going to treat him aggressively and keep close follow-up. Azithromycin, prednisone, chest x-ray. May continue to use over-the-counter cough and cold  medications. Return if not feeling great by the end of the week. ___________________________________________ Ihor Austinhomas J. Benjamin Stainhekkekandam, M.D., ABFM., CAQSM. Primary Care and Sports Medicine [facility]  Adjunct Instructor of Family Medicine  [employer]

## 2017-06-07 ENCOUNTER — Ambulatory Visit: Payer: BLUE CROSS/BLUE SHIELD | Admitting: Family Medicine

## 2017-10-04 ENCOUNTER — Ambulatory Visit (INDEPENDENT_AMBULATORY_CARE_PROVIDER_SITE_OTHER): Payer: BLUE CROSS/BLUE SHIELD | Admitting: Family Medicine

## 2017-10-04 ENCOUNTER — Encounter: Payer: Self-pay | Admitting: Family Medicine

## 2017-10-04 VITALS — BP 125/84 | HR 68 | Wt 297.0 lb

## 2017-10-04 DIAGNOSIS — K047 Periapical abscess without sinus: Secondary | ICD-10-CM

## 2017-10-04 MED ORDER — CEFDINIR 300 MG PO CAPS: 300.0000 mg | ORAL_CAPSULE | Freq: Two times a day (BID) | ORAL | 2 refills | Status: DC

## 2017-10-04 MED ORDER — TETANUS-DIPHTH-ACELL PERTUSSIS 5-2-15.5 LF-MCG/0.5 IM SUSP: 0.5000 mL | Freq: Once | INTRAMUSCULAR | 0 refills | Status: AC

## 2017-10-04 NOTE — Patient Instructions (Addendum)
Thank you for coming in today. You have antibiotic that you can take if you start getting another abscess.  [facility] is probably your best bet for tooth extraction.  However [facility] is a cash pay local clinic.   You will continue to have abssess until the tooth fall apart completely or is removed.   Get tdap vaccine at pharmacy.      Dental Abscess A dental abscess is a collection of pus in or around a tooth. What are the causes? This condition is caused by a bacterial infection around the root of the tooth that involves the inner part of the tooth (pulp). It may result from:  Severe tooth decay.  Trauma to the tooth that allows bacteria to enter into the pulp, such as a broken or chipped tooth.  Severe gum disease around a tooth.  What are the signs or symptoms? Symptoms of this condition include:  Severe pain in and around the infected tooth.  Swelling and redness around the infected tooth, in the mouth, or in the face.  Tenderness.  Pus drainage.  Bad breath.  Bitter taste in the mouth.  Difficulty swallowing.  Difficulty opening the mouth.  Nausea.  Vomiting.  Chills.  Swollen neck glands.  Fever.  How is this diagnosed? This condition is diagnosed with examination of the infected tooth. During the exam, your dentist may tap on the infected tooth. Your dentist will also ask about your medical and dental history and may order X-rays. How is this treated? This condition is treated by eliminating the infection. This may be done with:  Antibiotic medicine.  A root canal. This may be performed to save the tooth.  Pulling (extracting) the tooth. This may also involve draining the abscess. This is done if the tooth cannot be saved.  Follow these instructions at home:  Take medicines only as directed by your dentist.  If you were prescribed antibiotic medicine, finish all of it even if you start to feel better.  Rinse your  mouth (gargle) often with salt water to relieve pain or swelling.  Do not drive or operate heavy machinery while taking pain medicine.  Do not apply heat to the outside of your mouth.  Keep all follow-up visits as directed by your dentist. This is important. Contact a health care provider if:  Your pain is worse and is not helped by medicine. Get help right away if:  You have a fever or chills.  Your symptoms suddenly get worse.  You have a very bad headache.  You have problems breathing or swallowing.  You have trouble opening your mouth.  You have swelling in your neck or around your eye. This information is not intended to replace advice given to you by your health care provider. Make sure you discuss any questions you have with your health care provider. Document Released: 05/18/2005 Document Revised: 09/26/2015 Document Reviewed: 05/15/2014 Elsevier Interactive Patient Education  [employer].

## 2017-10-04 NOTE — Progress Notes (Signed)
       Brandon Kelley is a 32 y.o. male who presents to [name]: [facility] Medicine today for dental abscesses.  Seanpatrick notes recurrent dental abscesses posterior right and left upper and lower mouth.  He is managed to get them to drain on his own at home and feeling pretty good now.  He was recently prescribed antibiotics that did not help.  His medical history is significant for ankylosing spondylitis treated with Enbrel.  He notes that he does not have dental insurance and cannot afford a multi-thousand dollars dental extraction with oral surgery.   Past Medical History:  Diagnosis Date  . Ankylosing spondylitis (HCC)    No past surgical history on file. Social History   Tobacco Use  . Smoking status: Never Smoker  . Smokeless tobacco: Current User  Substance Use Topics  . Alcohol use: Yes    Alcohol/week: 0.0 oz   family history includes Cancer in his father; Diabetes in his paternal grandmother; Hypertension in his mother.  ROS as above:  Medications: Current Outpatient Medications  Medication Sig Dispense Refill  . etanercept (ENBREL SURECLICK) 50 MG/ML injection Inject 50 mg into the skin.    . cefdinir (OMNICEF) 300 MG capsule Take 1 capsule (300 mg total) by mouth 2 (two) times daily. For dental abscess 14 capsule 2  . Tdap (ADACEL) 09-30-13.5 LF-MCG/0.5 injection Inject 0.5 mLs into the muscle once for 1 dose. 0.5 mL 0   No current facility-administered medications for this visit.    Allergies  Allergen Reactions  . Penicillins     Health Maintenance Health Maintenance  Topic Date Due  . HIV Screening  11/11/2001  . TETANUS/TDAP  11/11/2005  . INFLUENZA VACCINE  12/30/2017     Exam:  BP 125/84   Pulse 68   Wt 297 lb (134.7 kg)   BMI 42.62 kg/m  Gen: Well NAD HEENT: EOMI,  MMM significant dental caries posterior molars upper and lower right and left.  No gum  erythema or abscess swelling. Lungs: Normal work of breathing. CTABL Heart: RRR no MRG Abd: NABS, Soft. Nondistended, Nontender Exts: Brisk capillary refill, warm and well perfused.  MSK: Patient is unable to extend his cervical spine     Assessment and Plan: 32 y.o. male with  Current dental abscess is worsened by immunosuppression Enbrel closing spondylitis needed for ankylosing spondylitis.  Patient ultimately will need dental extraction.  Unfortunately this is going to be hard to arrange.  I written a letter for his health insurance asking for coverage.  Recommend patient call his health insurance company as well.  Additionally [facility] may be a good option.  Back-up Omnicef for use if he worsens again.    No orders of the defined types were placed in this encounter.  Meds ordered this encounter  Medications  . cefdinir (OMNICEF) 300 MG capsule    Sig: Take 1 capsule (300 mg total) by mouth 2 (two) times daily. For dental abscess    Dispense:  14 capsule    Refill:  2  . Tdap (ADACEL) 09-30-13.5 LF-MCG/0.5 injection    Sig: Inject 0.5 mLs into the muscle once for 1 dose.    Dispense:  0.5 mL    Refill:  0     Discussed warning signs or symptoms. Please see discharge instructions. Patient expresses understanding.

## 2018-05-08 ENCOUNTER — Encounter (HOSPITAL_BASED_OUTPATIENT_CLINIC_OR_DEPARTMENT_OTHER): Payer: Self-pay

## 2018-05-08 ENCOUNTER — Emergency Department (HOSPITAL_BASED_OUTPATIENT_CLINIC_OR_DEPARTMENT_OTHER)
Admission: EM | Admit: 2018-05-08 | Discharge: 2018-05-08 | Disposition: A | Discharge status: Home / Self Care | Payer: BLUE CROSS/BLUE SHIELD | Attending: Emergency Medicine | Admitting: Emergency Medicine

## 2018-05-08 ENCOUNTER — Other Ambulatory Visit: Payer: Self-pay

## 2018-05-08 DIAGNOSIS — Z79899 Other long term (current) drug therapy: Secondary | ICD-10-CM | POA: Insufficient documentation

## 2018-05-08 DIAGNOSIS — R0981 Nasal congestion: Secondary | ICD-10-CM | POA: Diagnosis present

## 2018-05-08 DIAGNOSIS — J111 Influenza due to unidentified influenza virus with other respiratory manifestations: Secondary | ICD-10-CM | POA: Diagnosis not present

## 2018-05-08 DIAGNOSIS — R69 Illness, unspecified: Secondary | ICD-10-CM

## 2018-05-08 MED ORDER — IBUPROFEN 400 MG PO TABS
600.0000 mg | ORAL_TABLET | Freq: Once | ORAL | Status: AC
  Administered 2018-05-08: 600 mg via ORAL
  Filled 2018-05-08: qty 1

## 2018-05-08 MED ORDER — CETIRIZINE-PSEUDOEPHEDRINE ER 5-120 MG PO TB12: 1.0000 | ORAL_TABLET | Freq: Two times a day (BID) | ORAL | 0 refills | Status: DC

## 2018-05-08 MED ORDER — OSELTAMIVIR PHOSPHATE 75 MG PO CAPS: 75.0000 mg | ORAL_CAPSULE | Freq: Two times a day (BID) | ORAL | 0 refills | Status: DC

## 2018-05-08 MED ORDER — IBUPROFEN 600 MG PO TABS: 600.0000 mg | ORAL_TABLET | Freq: Four times a day (QID) | ORAL | 0 refills | Status: DC | PRN

## 2018-05-08 MED ORDER — ACETAMINOPHEN 500 MG PO TABS: 500.0000 mg | ORAL_TABLET | Freq: Four times a day (QID) | ORAL | 0 refills | Status: DC | PRN

## 2018-05-08 MED ORDER — BENZONATATE 100 MG PO CAPS: 100.0000 mg | ORAL_CAPSULE | Freq: Three times a day (TID) | ORAL | 0 refills | Status: DC

## 2018-05-08 NOTE — ED Provider Notes (Signed)
[facility] EMERGENCY DEPARTMENT Provider Note   CSN: [id number] Arrival date & time: 05/08/18  1303     History   Chief Complaint Chief Complaint  Patient presents with  . Influenza    HPI Brandon Kelley is a 32 y.o. male with history of ankylosing spondylitis who presents with a 1 day history of sinus congestion, fever, headache, body aches, cough that began suddenly yesterday.  He reports he felt like he had a fever all night, however he did not take his temperature.  He has had some mild pain in chest when he coughs, but denies any shortness of breath.  Patient denies any abdominal pain, nausea, vomiting, neck pain, ear pain, sore throat.  HPI  Past Medical History:  Diagnosis Date  . Ankylosing spondylitis [facility])     Patient Active Problem List   Diagnosis Date Noted  . Tremor 10/14/2015  . Vitamin D deficiency 06/18/2015  . Major depression 06/06/2015  . GAD (generalized anxiety disorder) 06/06/2015  . Obese 06/06/2015  . Other long term (current) drug therapy 10/04/2012  . Ankylosing spondylitis (HCC) 03/02/2012    History reviewed. No pertinent surgical history.      Home Medications    Prior to Admission medications   Medication Sig Start Date End Date Taking? Authorizing Provider  acetaminophen (TYLENOL) 500 MG tablet Take 1 tablet (500 mg total) by mouth every 6 (six) hours as needed. 05/08/18   Braedan Meuth, Waylan Boga, PA-C  benzonatate (TESSALON) 100 MG capsule Take 1 capsule (100 mg total) by mouth every 8 (eight) hours. 05/08/18   Ziyan Hillmer, Waylan Boga, PA-C  cefdinir (OMNICEF) 300 MG capsule Take 1 capsule (300 mg total) by mouth 2 (two) times daily. For dental abscess 10/04/17   Rodolph Bong, MD  cetirizine-pseudoephedrine (ZYRTEC-D) 5-120 MG tablet Take 1 tablet by mouth 2 (two) times daily. 05/08/18   Glory Graefe, Waylan Boga, PA-C  etanercept (ENBREL SURECLICK) 50 MG/ML injection Inject 50 mg into the skin. 12/18/13   [provider]  ibuprofen  (ADVIL,MOTRIN) 600 MG tablet Take 1 tablet (600 mg total) by mouth every 6 (six) hours as needed. 05/08/18   Xavi Tomasik, Waylan Boga, PA-C  oseltamivir (TAMIFLU) 75 MG capsule Take 1 capsule (75 mg total) by mouth every 12 (twelve) hours. 05/08/18   Emi Holes, PA-C    Family History Family History  Problem Relation Age of Onset  . Hypertension Mother   . Cancer Father   . Diabetes Paternal Grandmother     Social History Social History   Tobacco Use  . Smoking status: Never Smoker  . Smokeless tobacco: Current User    Types: Snuff  Substance Use Topics  . Alcohol use: Not Currently    Alcohol/week: 0.0 standard drinks  . Drug use: No     Allergies   Penicillins   Review of Systems Review of Systems  Constitutional: Positive for chills and fever.  HENT: Positive for congestion. Negative for ear pain, facial swelling and sore throat.   Respiratory: Positive for cough. Negative for shortness of breath.   Cardiovascular: Positive for chest pain (only mild with coughing).  Gastrointestinal: Negative for abdominal pain, nausea and vomiting.  Genitourinary: Negative for dysuria.  Musculoskeletal: Negative for back pain.  Skin: Negative for rash and wound.  Neurological: Positive for headaches.  Psychiatric/Behavioral: The patient is not nervous/anxious.      Physical Exam Updated Vital Signs BP 110/82 (BP Location: Left Arm)   Pulse 84   Temp 98.9 F (  37.2 C) (Oral)   Resp 20   Ht 5\' 10"  (1.778 m)   Wt 131.5 kg   SpO2 98%   BMI 41.61 kg/m   Physical Exam  Constitutional: He appears well-developed and well-nourished. No distress.  HENT:  Head: Normocephalic and atraumatic.  Right Ear: Tympanic membrane normal.  Left Ear: Tympanic membrane normal.  Mouth/Throat: Oropharynx is clear and moist. No oropharyngeal exudate, posterior oropharyngeal edema, posterior oropharyngeal erythema or tonsillar abscesses. Tonsils are 1+ on the right. Tonsils are 1+ on the left. No  tonsillar exudate.  Eyes: Pupils are equal, round, and reactive to light. Conjunctivae are normal. Right eye exhibits no discharge. Left eye exhibits no discharge. No scleral icterus.  Neck: Normal range of motion. Neck supple. No thyromegaly present.  Cardiovascular: Normal rate, regular rhythm, normal heart sounds and intact distal pulses. Exam reveals no gallop and no friction rub.  No murmur heard. Pulmonary/Chest: Effort normal and breath sounds normal. No stridor. No respiratory distress. He has no wheezes. He has no rales.  Abdominal: Soft. Bowel sounds are normal. He exhibits no distension. There is no tenderness. There is no rebound and no guarding.  Musculoskeletal: He exhibits no edema.  Lymphadenopathy:    He has no cervical adenopathy.  Neurological: He is alert. Coordination normal.  Skin: Skin is warm and dry. No rash noted. He is not diaphoretic. No pallor.  Psychiatric: He has a normal mood and affect.  Nursing note and vitals reviewed.    ED Treatments / Results  Labs (all labs ordered are listed, but only abnormal results are displayed) Labs Reviewed  INFLUENZA PANEL BY PCR (TYPE A & B)    EKG None  Radiology No results found.  Procedures Procedures (including critical care time)  Medications Ordered in ED Medications  ibuprofen (ADVIL,MOTRIN) tablet 600 mg (600 mg Oral Given 05/08/18 1616)     Initial Impression / Assessment and Plan / ED Course  I have reviewed the triage vital signs and the nursing notes.  Pertinent labs & imaging results that were available during my care of the patient were reviewed by me and considered in my medical decision making (see chart for details).     Patient with symptoms consistent with influenza.  Vitals are stable, no fever.  No signs of dehydration, tolerating PO's.  Lungs are clear. Due to patient's presentation and physical exam a chest x-ray was not ordered bc likely diagnosis of flu.  I discussed the cost versus  benefit versus side effects of Tamiflu and patient would like to have a prescription for Tamiflu.  Influenza screening sent and pending and patient advised not to fill Tamiflu unless positive result.  He is advised to check this on MyChart.  Patient will be discharged with instructions to orally hydrate, rest, and use over-the-counter medications such as anti-inflammatories ibuprofen and Aleve for muscle aches and Tylenol for fever.  Patient will also be given a cough suppressant.  Return precautions discussed.  Patient understands and agrees with plan.  Patient vitals stable throughout ED course and discharged in satisfactory condition.   Final Clinical Impressions(s) / ED Diagnoses   Final diagnoses:  Influenza-like illness    ED Discharge Orders         Ordered    ibuprofen (ADVIL,MOTRIN) 600 MG tablet  Every 6 hours PRN     05/08/18 1623    acetaminophen (TYLENOL) 500 MG tablet  Every 6 hours PRN     05/08/18 1623    cetirizine-pseudoephedrine (ZYRTEC-D)  5-120 MG tablet  2 times daily     05/08/18 1623    benzonatate (TESSALON) 100 MG capsule  Every 8 hours     05/08/18 1623    oseltamivir (TAMIFLU) 75 MG capsule  Every 12 hours     05/08/18 1623           Emi HolesLaw, Mylinh Cragg M, PA-C 05/08/18 1625    Raeford RazorKohut, Stephen, MD 05/12/18 [phone]

## 2018-05-08 NOTE — Discharge Instructions (Signed)
Alternate ibuprofen and Tylenol as prescribed for your body aches and fever.  Take Tessalon every 8 hours as needed for cough.  Take Zyrtec-D 1-2 times daily for your congestion.  If you test positive for the flu, which can be followed up on "MyChart," begin taking Tamiflu.  Please follow-up with your doctor as needed.  Please return the emergency department develop any new or worsening symptoms.

## 2018-05-08 NOTE — ED Triage Notes (Signed)
Pt reports fever, body aches, HA, cough, and URI symptoms that started yesterday.

## 2018-05-09 LAB — INFLUENZA PANEL BY PCR (TYPE A & B)
Influenza A By PCR: POSITIVE — AB
Influenza B By PCR: NEGATIVE

## 2018-10-11 ENCOUNTER — Encounter (INDEPENDENT_AMBULATORY_CARE_PROVIDER_SITE_OTHER): Payer: Self-pay

## 2018-10-11 ENCOUNTER — Telehealth: Payer: BLUE CROSS/BLUE SHIELD | Admitting: Physician Assistant

## 2018-10-11 ENCOUNTER — Telehealth: Payer: Self-pay

## 2018-10-11 DIAGNOSIS — B349 Viral infection, unspecified: Secondary | ICD-10-CM

## 2018-10-11 NOTE — Progress Notes (Signed)
-Visit for Corona Virus Screening  Based on your current symptoms, you may very well have the virus, however your symptoms are mild. Currently, not all patients are being tested. If the symptoms are mild and there is not a known exposure, performing the test is not indicated.  You have been enrolled in MyChart Home Monitoring for COVID-19. Daily you will receive a questionnaire within the MyChart website. Our COVID-19 response team will be monitoring your responses daily.   Coronavirus disease 2019 (COVID-19)is a respiratory illness that can spread from person to person. The virus that causes COVID-19 is a new virus that was first identified in the country of [place] but is now found in multiple other countries and has spread to the [place].Symptoms associated with the virus are mild to severe fever, cough, and shortness of breath. There is currently no vaccine to protect against COVID-19, and there is no specific antiviral treatment for the virus.  To be considered HIGH RISK for Coronavirus (COVID-19), you have to meet the following criteria:  . Traveled to [place], [place], [place], [place] or [place]; or in the [place] to [place], [place], [place], or [place]; and have fever, cough, and shortness of breath within the last 2 weeks of travel OR  . Been in close contact with a person diagnosed with COVID-19 within the last 2 weeks and have fever, cough, and shortness of breath  . IF YOU DO NOT MEET THESE CRITERIA, YOU ARE CONSIDERED LOW RISK FOR COVID-19.   It is vitally important that if you feel that you have an infection such as this virus or any other virus that you stay home and away from places where you may spread it to others.You should self-quarantine for 14 days if you have symptoms that could potentially be coronavirus and avoid contact with people age 45 and older.     You may take acetaminophen (Tylenol) 1000 mg every 8 hours as needed for fever.   Reduce your  risk of any infection by using the same precautions used for avoiding the common cold or flu: Wash your hands often with soap and warm water for at least 20 seconds.If soap and water are not readily available, use an alcohol-based hand sanitizer with at least 60% alcohol.  If coughing or sneezing, cover your mouth and nose by coughing or sneezing into the elbow areas of your shirt or coat, into a tissue or into your sleeve (not your hands). Avoid shaking hands with others and consider head nods or verbal greetings only.  Avoid touching your eyes,nose, or mouth with unwashed hands. Avoid close contact with people who are sick. Avoid places or events with large numbers of people in one location, like concerts or sporting events. Carefully consider travel plans you have or are making. If you are planning any travel outside or inside the [place], visit the CDC'sTravelers' Health webpagefor the latest health notices. If you have some symptoms but not all symptoms, continue to monitor at home and seek medical attention if your symptoms worsen. If you are having a medical emergency, call 911.  HOME CARE Only take medications as instructed by your medical team. Drink plenty of fluids and get plenty of rest. A steam or ultrasonic humidifier can help if you have congestion.   GET HELP RIGHT AWAY IF: You develop worsening fever. You become short of breath You cough up blood. Your symptoms become more severe MAKE SURE YOU  Understand these instructions. Will watch your condition.  Will get help right away if you are not doing well or get worse.  Your e-visit answers were reviewed by a board certified advanced clinical practitioner to complete your personal care plan.Depending on the condition, your plan could have included both over the counter or prescription medications.If there is a problem please reply once you have received a response from your provider. Your safety is important to us.If  you have drug allergies check your prescription carefully.  You can use MyChart to ask questions about today's visit, request a non-urgent call back, or ask for a work or school excuse for 24 hours related to this e-Visit. If it has been greater than 24 hours you will need to follow up with your provider, or enter a new e-Visit to address those concerns. You will get an e-mail in the next two days asking about your experience.I hope that your e-visit has been valuable and will speed your recovery. Thank you for using e-visits.     ----- Message -----  From: Brandon Kelley  Sent: 5/12/20203:36 PM EDT  To: E-Visit Mailing List Subject: E-Visit Submission: CoronaVirus (COVID-19) Screening  E-Visit Submission: CoronaVirus (COVID-19) Screening --------------------------------  Question: Do you have any of the following?  Answer: Cough  Question: Do you have any of the following additional symptoms?  Answer: Stuffy nose  Diarrhea  Question: Have you had a fever? Answer: No  Question: Have others in your home or workplace had similar symptoms? Answer: No  Question: When did your symptoms start? Answer: Haven't yet  Question: Have you recently visited any of the following countries? Answer: None of these  Question: If you have traveled anywhere in the last2 months please document where you have visited: Answer:   Question: Have you recently been around others from these countries or visited these countries who have had coughing or fever? Answer: No  Question: Have you recently been around anyone who has been diagnosed with Corona virus? Answer: Yes  Question: Have you been taking any medications? Answer: Yes  Question: If taking medications for these symptoms, please list the names and whether they are helping or not Answer:   Question: Are you treated for any of the following conditions: Asthma, COPD, Diabetes, Renal Failure  (on Dialysis), AIDS, any Neuromuscular disease that effects the clearing of secretions, Heart Failure, or Heart Disease? Answer: No  Question: Please enter a phone number where you can be reached if we have additional questions about your symptoms Answer: [phone]  Question: Please list your medication allergies that you may have ? (If 'none' , please list as 'none') Answer: None  Question: Please list any additional comments  Answer: My sister in law was tested postive today, she spent most of the day at our home last monday.  A total of 5-10 minutes was spent evaluating this patients questionnaire and formulating a plan of care.

## 2018-10-11 NOTE — Telephone Encounter (Signed)
Called and spoke with pt about appetite and diarrhea. Pt said that he was taking Pepto Bismol that seem to be helping with stomach pain and diarrhea, told pt  to drink plenty of fluids, and eat bland diet like soup, for his appetite.  If diarrhea gets worse call his pcp . Pt stated that his temp was 99.3 .

## 2018-10-18 ENCOUNTER — Encounter: Payer: Self-pay | Admitting: Family Medicine

## 2018-10-19 ENCOUNTER — Encounter: Payer: Self-pay | Admitting: Family Medicine

## 2018-10-19 ENCOUNTER — Telehealth (INDEPENDENT_AMBULATORY_CARE_PROVIDER_SITE_OTHER): Payer: BLUE CROSS/BLUE SHIELD | Admitting: Family Medicine

## 2018-10-19 VITALS — Temp 98.0°F | Ht 70.0 in | Wt 305.0 lb

## 2018-10-19 DIAGNOSIS — F331 Major depressive disorder, recurrent, moderate: Secondary | ICD-10-CM

## 2018-10-19 DIAGNOSIS — R109 Unspecified abdominal pain: Secondary | ICD-10-CM

## 2018-10-19 DIAGNOSIS — F411 Generalized anxiety disorder: Secondary | ICD-10-CM

## 2018-10-19 MED ORDER — BUPROPION HCL ER (XL) 150 MG PO TB24: 150.0000 mg | ORAL_TABLET | ORAL | 0 refills | Status: DC

## 2018-10-19 MED ORDER — SERTRALINE HCL 100 MG PO TABS: 100.0000 mg | ORAL_TABLET | Freq: Every day | ORAL | 0 refills | Status: DC

## 2018-10-19 MED ORDER — DICYCLOMINE HCL 10 MG PO CAPS: 10.0000 mg | ORAL_CAPSULE | Freq: Three times a day (TID) | ORAL | 1 refills | Status: DC

## 2018-10-19 NOTE — Progress Notes (Signed)
Virtual Visit  via Video Note  I connected with  Brandon Rileavid Kelley by a video enabled telemedicine application and verified that I am speaking with the correct person using two identifiers.   I discussed the limitations of evaluation and management by telemedicine and the availability of in person appointments. The patient expressed understanding and agreed to proceed.  History of Present Illness: Brandon Kelley is a 33 y.o. male who would like to discuss mood worsening.  Patient notes that over the last several months has had worsening irritability.  He has a history of anxiety and depression that several years ago was well controlled with Zoloft and Wellbutrin.  He notes recently has had increased stressors.  He is a 32-year-old daughter who he primarily has to take care of.  His wife has foot problems and has trouble standing after a long day of work.  Also he is living with his mother who is demanding.  He notes this is caused increased stressors.  Since COVID-19 lockdown he also notes decreased outlets and more stressors as well.  He notes worsening irritability anxiety and insomnia.  He is interested in restarting his medication.  Additionally he notes episodes of abdominal bloating gas diarrhea and cramping.  He notes this waxes and wanes and has been ongoing for several months.  He notes it is worse when he is more anxious.    Observations/Objective: Temp 98 F (36.7 C) (Oral)   Ht 5\' 10"  (1.778 m)   Wt (!) 305 lb (138.3 kg)   BMI 43.76 kg/m  Wt Readings from Last 5 Encounters:  10/19/18 (!) 305 lb (138.3 kg)  05/08/18 290 lb (131.5 kg)  10/04/17 297 lb (134.7 kg)  05/03/17 (!) 306 lb (138.8 kg)  04/20/16 (!) 306 lb (138.8 kg)   Exam: Appearance nontoxic no acute distress Normal Speech.  Psych alert and oriented normal speech thought process and affect.  [facility]19[facility]2018 04/20/2016  Decreased Interest 1 0  Down, Depressed, Hopeless 1 1  PHQ - 2 Score 2 1   Altered sleeping 1 0  Tired, decreased energy 3 2  Change in appetite 2 1  Feeling bad or failure about yourself  2 1  Trouble concentrating 3 0  Moving slowly or fidgety/restless 0 0  Suicidal thoughts 0 0  PHQ-9 Score 13 5  Difficult doing work/chores Somewhat difficult Not difficult at all   GAD 7 : Generalized Anxiety Score 10/19/2018  Nervous, Anxious, on Edge 3  Control/stop worrying 2  Worry too much - different things 3  Trouble relaxing 2  Restless 1  Easily annoyed or irritable 3  Afraid - awful might happen 0  Total GAD 7 Score 14  Anxiety Difficulty Somewhat difficult     Lab and Radiology Results No results found for this or any previous visit (from the past 72 hour(s)). No results found.   Assessment and Plan: 33 y.o. male with  Anxiety and depression symptoms.  Worsening recently.  Plan to restart Zoloft and Wellbutrin.  Check back in about 2-1/2 weeks.  Precautions reviewed.  Plan for titration of both Zoloft and delayed start Wellbutrin to minimize side effects.  Diarrhea abdominal cramping and bloating.  Likely IBS.  After discussion plan to try dicyclomine.  Check back in 2 to 3 weeks.  If not improving from abdominal standpoint likely will make next visit in an office visit.  PDMP not reviewed this encounter. No orders of the defined types were placed in  this encounter.  No orders of the defined types were placed in this encounter.   Follow Up Instructions:    I discussed the assessment and treatment plan with the patient. The patient was provided an opportunity to ask questions and all were answered. The patient agreed with the plan and demonstrated an understanding of the instructions.   The patient was advised to call back or seek an in-person evaluation if the symptoms worsen or if the condition fails to improve as anticipated.  Time: 25 minutes of intraservice time, with >39 minutes of total time during today's visit.       Historical  information moved to improve visibility of documentation.  Past Medical History:  Diagnosis Date  . Ankylosing spondylitis (HCC)    No past surgical history on file. Social History   Tobacco Use  . Smoking status: Never Smoker  . Smokeless tobacco: Current User    Types: Snuff  Substance Use Topics  . Alcohol use: Not Currently    Alcohol/week: 0.0 standard drinks   family history includes Cancer in his father; Diabetes in his paternal grandmother; Hypertension in his mother.  Medications: Current Outpatient Medications  Medication Sig Dispense Refill  . etanercept (ENBREL SURECLICK) 50 MG/ML injection Inject 50 mg into the skin.     No current facility-administered medications for this visit.    Allergies  Allergen Reactions  . Penicillins

## 2018-10-19 NOTE — Patient Instructions (Addendum)
Thank you for coming in today. We will restart Zoloft and Wellbutrin.  Take 1/2 pill of Zoloft for 1 week and increase to full pill in 1 week.  Start Wellbutrin a few days after starting Zoloft.  If you feel weird decrease the dose of Zoloft.  Check back Monday June 8 at 2pm with me. You will get ahead of time.   Use dicyclomine up to 4 times daily as needed for abdominal cramping or diarrhea.   Irritable Bowel Syndrome, Adult  Irritable bowel syndrome (IBS) is a group of symptoms that affects the organs responsible for digestion (gastrointestinal or GI tract). IBS is not one specific disease. To regulate how the GI tract works, the body sends signals back and forth between the intestines and the brain. If you have IBS, there may be a problem with these signals. As a result, the GI tract does not function normally. The intestines may become more sensitive and overreact to certain things. This may be especially true when you eat certain foods or when you are under stress. There are four types of IBS. These may be determined based on the consistency of your stool (feces):  IBS with diarrhea.  IBS with constipation.  Mixed IBS.  Unsubtyped IBS. It is important to know which type of IBS you have. Certain treatments are more likely to be helpful for certain types of IBS. What are the causes? The exact cause of IBS is not known. What increases the risk? You may have a higher risk for IBS if you:  Are male.  Are younger than 52.  Have a family history of IBS.  Have a mental health condition, such as depression, anxiety, or post-traumatic stress disorder.  Have had a bacterial infection of your GI tract. What are the signs or symptoms? Symptoms of IBS vary from person to person. The main symptom is abdominal pain or discomfort. Other symptoms usually include one or more of the following:  Diarrhea, constipation, or both.  Abdominal swelling or bloating.  Feeling full after  eating a small or regular-sized meal.  Frequent gas.  Mucus in the stool.  A feeling of having more stool left after a bowel movement. Symptoms tend to come and go. They may be triggered by stress, mental health conditions, or certain foods. How is this diagnosed? This condition may be diagnosed based on a physical exam, your medical history, and your symptoms. You may have tests, such as:  Blood tests.  Stool test.  X-rays.  CT scan.  Colonoscopy. This is a procedure in which your GI tract is viewed with a long, thin, flexible tube. How is this treated? There is no cure for IBS, but treatment can help relieve symptoms. Treatment depends on the type of IBS you have, and may include:  Changes to your diet, such as: ? Avoiding foods that cause symptoms. ? Drinking more water. ? Following a low-FODMAP (fermentable oligosaccharides, disaccharides, monosaccharides, and polyols) diet for up to 6 weeks, or as told by your health care provider. FODMAPs are sugars that are hard for some people to digest. ? Eating more fiber. ? Eating medium-sized meals at the same times every day.  Medicines. These may include: ? Fiber supplements, if you have constipation. ? Medicine to control diarrhea (antidiarrheal medicines). ? Medicine to help control muscle tightening (spasms) in your GI tract (antispasmodic medicines). ? Medicines to help with mental health conditions, such as antidepressants or tranquilizers.  Talk therapy or counseling.  Working with a diet  and nutrition specialist (dietitian) to help create a food plan that is right for you.  Managing your stress. Follow these instructions at home: Eating and drinking  Eat a healthy diet.  Eat medium-sized meals at about the same time every day. Do not eat large meals.  Gradually eat more fiber-rich foods. These include whole grains, fruits, and vegetables. This may be especially helpful if you have IBS with constipation.  Eat a  diet low in FODMAPs.  Drink enough fluid to keep your urine pale yellow.  Keep a journal of foods that seem to trigger symptoms.  Avoid foods and drinks that: ? Contain added sugar. ? Make your symptoms worse. Dairy products, caffeinated drinks, and carbonated drinks can make symptoms worse for some people. General instructions  Take over-the-counter and prescription medicines and supplements only as told by your health care provider.  Get enough exercise. Do at least 150 minutes of moderate-intensity exercise each week.  Manage your stress. Getting enough sleep and exercise can help you manage stress.  Keep all follow-up visits as told by your health care provider and therapist. This is important. Alcohol Use  Do not drink alcohol if: ? Your health care provider tells you not to drink. ? You are pregnant, may be pregnant, or are planning to become pregnant.  If you drink alcohol, limit how much you have: ? 0-1 drink a day for women. ? 0-2 drinks a day for men.  Be aware of how much alcohol is in your drink. In the [place] one drink equals one typical bottle of beer (12 oz), one-half glass of wine (5 oz), or one shot of hard liquor (1 oz). Contact a health care provider if you have:  Constant pain.  Weight loss.  Difficulty or pain when swallowing.  Diarrhea that gets worse. Get help right away if you have:  Severe abdominal pain.  Fever.  Diarrhea with symptoms of dehydration, such as dizziness or dry mouth.  Bright red blood in your stool.  Stool that is black and tarry.  Abdominal swelling.  Vomiting that does not stop.  Blood in your vomit. Summary  Irritable bowel syndrome (IBS) is not one specific disease. It is a group of symptoms that affects digestion.  Your intestines may become more sensitive and overreact to certain things. This may be especially true when you eat certain foods or when you are under stress.  There is no cure for IBS, but  treatment can help relieve symptoms. This information is not intended to replace advice given to you by your health care provider. Make sure you discuss any questions you have with your health care provider. Document Released: 05/18/2005 Document Revised: 05/11/2017 Document Reviewed: 05/11/2017 Elsevier Interactive Patient Education  2019 Elsevier Inc.    Diet for Irritable Bowel Syndrome When you have irritable bowel syndrome (IBS), it is very important to eat the foods and follow the eating habits that are best for your condition. IBS may cause various symptoms such as pain in the abdomen, constipation, or diarrhea. Choosing the right foods can help to ease the discomfort from these symptoms. Work with your health care provider and diet and nutrition specialist (dietitian) to find the eating plan that will help to control your symptoms. What are tips for following this plan?      Keep a food diary. This will help you identify foods that cause symptoms. Write down: ? What you eat and when you eat it. ? What symptoms you have. ? When  symptoms occur in relation to your meals, such as "pain in abdomen 2 hours after dinner."  Eat your meals slowly and in a relaxed setting.  Aim to eat 5-6 small meals per day. Do not skip meals.  Drink enough fluid to keep your urine pale yellow.  Ask your health care provider if you should take an over-the-counter probiotic to help restore healthy bacteria in your gut (digestive tract). ? Probiotics are foods that contain good bacteria and yeasts.  Your dietitian may have specific dietary recommendations for you based on your symptoms. He or she may recommend that you: ? Avoid foods that cause symptoms. Talk with your dietitian about other ways to get the same nutrients that are in those problem foods. ? Avoid foods with gluten. Gluten is a protein that is found in rye, wheat, and barley. ? Eat more foods that contain soluble fiber. Examples of foods  with high soluble fiber include oats, seeds, and certain fruits and vegetables. Take a fiber supplement if directed by your dietitian. ? Reduce or avoid certain foods called FODMAPs. These are foods that contain carbohydrates that are hard to digest. Ask your doctor which foods contain these carbohydrates. What foods are not recommended? The following are some foods and drinks that may make your symptoms worse:  Fatty foods, such as french fries.  Foods that contain gluten, such as pasta and cereal.  Dairy products, such as milk, cheese, and ice cream.  Chocolate.  Alcohol.  Products with caffeine, such as coffee.  Carbonated drinks, such as soda.  Foods that are high in FODMAPs. These include certain fruits and vegetables.  Products with sweeteners such as honey, high fructose corn syrup, sorbitol, and mannitol. The items listed above may not be a complete list of foods and beverages you should avoid. Contact a dietitian for more information. What foods are good sources of fiber? Your health care provider or dietitian may recommend that you eat more foods that contain fiber. Fiber can help to reduce constipation and other IBS symptoms. Add foods with fiber to your diet a little at a time so your body can get used to them. Too much fiber at one time might cause gas and swelling of your abdomen. The following are some foods that are good sources of fiber:  Berries, such as raspberries, strawberries, and blueberries.  Tomatoes.  Carrots.  Brown rice.  Oats.  Seeds, such as chia and pumpkin seeds. The items listed above may not be a complete list of recommended sources of fiber. Contact your dietitian for more options. Where to find more information  International Foundation for Functional Gastrointestinal Disorders: www.iffgd.[employer] of Diabetes and Digestive and Kidney Diseases: Car[employer].tn Summary  When you have irritable bowel syndrome (IBS), it is  very important to eat the foods and follow the eating habits that are best for your condition.  IBS may cause various symptoms such as pain in the abdomen, constipation, or diarrhea.  Choosing the right foods can help to ease the discomfort that comes from symptoms.  Keep a food diary. This will help you identify foods that cause symptoms.  Your health care provider or diet and nutrition specialist (dietitian) may recommend that you eat more foods that contain fiber. This information is not intended to replace advice given to you by your health care provider. Make sure you discuss any questions you have with your health care provider. Document Released: 08/08/2003 Document Revised: 12/13/2017 Document Reviewed: 01/19/2017 Elsevier Interactive Patient Education  2019 Elsevier Inc.   Dicyclomine tablets or capsules What is this medicine? DICYCLOMINE (dye SYE kloe meen) is used to treat bowel problems including irritable bowel syndrome. This medicine may be used for other purposes; ask your health care provider or pharmacist if you have questions. COMMON BRAND NAME(S): Bentyl What should I tell my health care provider before I take this medicine? They need to know if you have any of these conditions: -difficulty passing urine -esophagus problems or heartburn -glaucoma -heart disease, or previous heart attack -myasthenia gravis -prostate trouble -stomach infection, or obstruction -ulcerative colitis -an unusual or allergic reaction to dicyclomine, other medicines, foods, dyes, or preservatives -pregnant or trying to get pregnant -breast-feeding How should I use this medicine? Take this medicine by mouth with a glass of water. Follow the directions on the prescription label. It is best to take this medicine on an empty stomach, 30 minutes to 1 hour before meals. Take your medicine at regular intervals. Do not take your medicine more often than directed. Talk to your pediatrician  regarding the use of this medicine in children. Special care may be needed. While this drug may be prescribed for children as young as 316 months of age for selected conditions, precautions do apply. Patients over 32 years old may have a stronger reaction and need a smaller dose. Overdosage: If you think you have taken too much of this medicine contact a poison control center or emergency room at once. NOTE: This medicine is only for you. Do not share this medicine with others. What if I miss a dose? If you miss a dose, take it as soon as you can. If it is almost time for your next dose, take only that dose. Do not take double or extra doses. What may interact with this medicine? -amantadine -antacids -benztropine -digoxin -disopyramide -medicines for allergies, colds and breathing difficulties -medicines for alzheimer's disease -medicines for anxiety or sleeping problems -medicines for depression or psychotic disturbances -medicines for diarrhea -medicines for pain -metoclopramide -tegaserod This list may not describe all possible interactions. Give your health care provider a list of all the medicines, herbs, non-prescription drugs, or dietary supplements you use. Also tell them if you smoke, drink alcohol, or use illegal drugs. Some items may interact with your medicine. What should I watch for while using this medicine? You may get drowsy, dizzy, or have blurred vision. Do not drive, use machinery, or do anything that needs mental alertness until you know how this medicine affects you. To reduce the risk of dizzy or fainting spells, do not sit or stand up quickly, especially if you are an older patient. Alcohol can make you more drowsy, avoid alcoholic drinks. Stay out of bright light and wear sunglasses if this medicine makes your eyes more sensitive to light. Avoid extreme heat (hot tubs, saunas). This medicine can cause you to sweat less than normal. Your body temperature could increase  to dangerous levels, which may lead to heat stroke. Antacids can stop this medicine from working. If you get an upset stomach and want to take an antacid, make sure there is an interval of at least 1 to 2 hours before or after you take this medicine. Your mouth may get dry. Chewing sugarless gum or sucking hard candy, and drinking plenty of water may help. Contact your doctor if the problem does not go away or is severe. What side effects may I notice from receiving this medicine? Side effects that you should report to your doctor  or health care professional as soon as possible: -agitation, nervousness, confusion -difficulty swallowing -dizziness, drowsiness -fast or slow heartbeat -hallucinations -pain or difficulty passing urine Side effects that usually do not require medical attention (report to your doctor or health care professional if they continue or are bothersome): -constipation -headache -nausea or vomiting -sexual difficulty This list may not describe all possible side effects. Call your doctor for medical advice about side effects. You may report side effects to FDA at 1-800-FDA-1088. Where should I keep my medicine? Keep out of the reach of children. Store at room temperature below 30 degrees C (86 degrees F). Protect from light. Throw away any unused medicine after the expiration date. NOTE: This sheet is a summary. It may not cover all possible information. If you have questions about this medicine, talk to your doctor, pharmacist, or health care provider.  2019 Elsevier/Gold Standard (2007-09-06 17:12:34)

## 2018-10-19 NOTE — Progress Notes (Signed)
PHQ/GAD completed. Wanting to restart Zoloft/wellbutrin. MyChart msg yesterday states: "Anxiety/depression seems to be flaring up. Life stress stuff. I've noticed it seems to present as anger sometimes. Just feeling really mad sometimes when I'm stressed. I've started getting diarrhea/ bloating/gas it comes and goes, at least once or so a week. I thought maybe it was a food allergy/intolerance at first. "

## 2018-11-07 ENCOUNTER — Telehealth (INDEPENDENT_AMBULATORY_CARE_PROVIDER_SITE_OTHER): Payer: BLUE CROSS/BLUE SHIELD | Admitting: Family Medicine

## 2018-11-07 ENCOUNTER — Encounter: Payer: Self-pay | Admitting: Family Medicine

## 2018-11-07 VITALS — Temp 98.7°F | Wt 296.4 lb

## 2018-11-07 DIAGNOSIS — R109 Unspecified abdominal pain: Secondary | ICD-10-CM

## 2018-11-07 DIAGNOSIS — F411 Generalized anxiety disorder: Secondary | ICD-10-CM

## 2018-11-07 DIAGNOSIS — Z23 Encounter for immunization: Secondary | ICD-10-CM

## 2018-11-07 DIAGNOSIS — F331 Major depressive disorder, recurrent, moderate: Secondary | ICD-10-CM

## 2018-11-07 MED ORDER — TETANUS-DIPHTH-ACELL PERTUSSIS 5-2-15.5 LF-MCG/0.5 IM SUSP: 0.5000 mL | Freq: Once | INTRAMUSCULAR | 0 refills | Status: AC

## 2018-11-07 NOTE — Progress Notes (Signed)
Virtual Visit  via Video Note  I connected with      Brandon Kelley by a video enabled telemedicine application and verified that I am speaking with the correct person using two identifiers.   I discussed the limitations of evaluation and management by telemedicine and the availability of in person appointments. The patient expressed understanding and agreed to proceed.  History of Present Illness: Brandon RileDavid Balsam is a 33 y.o. male who would like to discuss irritability.  Patient was seen on May 20 for worsening anxiety and depression symptoms.  He had developed increased stressors since COVID-19 lockdown.  In the past he had had anxiety and depression symptoms that were controlled with Zoloft and Wellbutrin.  We plan to restart both of those medications.  In the interim he notes that things are going well.  He feels a lot happy with how things are going.  He notes less anxiety and depression symptoms.  Additionally he had developed diarrhea abdominal cramping and bloating that I thought were likely due to IBS.  We will try dicyclomine.  In the interim he notes that his symptoms resolved without ever having to take the dicyclomine.   Observations/Objective: Temp 98.7 F (37.1 C) (Oral)   Wt 296 lb 6.4 oz (134.4 kg)   BMI 42.53 kg/m  Wt Readings from Last 5 Encounters:  11/07/18 296 lb 6.4 oz (134.4 kg)  10/19/18 (!) 305 lb (138.3 kg)  05/08/18 290 lb (131.5 kg)  10/04/17 297 lb (134.7 kg)  05/03/17 (!) 306 lb (138.8 kg)   Exam: Appearance nontoxic no acute distress Psych: Oriented normal speech thought process and affect.  [facility] 11/07/2018 10/19/2018 04/20/2016  Decreased Interest 1 1 0  Down, Depressed, Hopeless 1 1 1   PHQ - 2 Score 2 2 1   Altered sleeping 2 1 0  Tired, decreased energy 1 3 2   Change in appetite 0 2 1  Feeling bad or failure about yourself  1 2 1   Trouble concentrating 2 3 0  Moving slowly or fidgety/restless 0 0 0  Suicidal thoughts 0 0 0   PHQ-9 Score 8 13 5   Difficult doing work/chores Not difficult at all Somewhat difficult Not difficult at all    GAD 7 : Generalized Anxiety Score 11/07/2018 10/19/2018  Nervous, Anxious, on Edge 2 3  Control/stop worrying 1 2  Worry too much - different things 1 3  Trouble relaxing 1 2  Restless 0 1  Easily annoyed or irritable 0 3  Afraid - awful might happen 1 0  Total GAD 7 Score 6 14  Anxiety Difficulty Not difficult at all Somewhat difficult     Lab and Radiology Results No results found for this or any previous visit (from the past 72 hour(s)). No results found.   Assessment and Plan: 33 y.o. male with anxiety and depression.  Improved on medication.  Plan to continue current regimen and reassess in about 2 months or sooner if needed.  Additionally patient due for Tdap vaccine.  Will send prescription to pharmacy.  He should be able to get it there if not he can schedule nurse visit.  PDMP not reviewed this encounter. No orders of the defined types were placed in this encounter.  No orders of the defined types were placed in this encounter.   Follow Up Instructions:    I discussed the assessment and treatment plan with the patient. The patient was provided an opportunity to ask questions and all were  answered. The patient agreed with the plan and demonstrated an understanding of the instructions.   The patient was advised to call back or seek an in-person evaluation if the symptoms worsen or if the condition fails to improve as anticipated.  Time: 15 minutes of intraservice time, with >22 minutes of total time during today's visit.      Historical information moved to improve visibility of documentation.  Past Medical History:  Diagnosis Date  . Ankylosing spondylitis ([place])    No past surgical history on file. Social History   Tobacco Use  . Smoking status: Never Smoker  . Smokeless tobacco: Current User    Types: Snuff  Substance Use Topics  . Alcohol  use: Not Currently    Alcohol/week: 0.0 standard drinks   family history includes Cancer in his father; Diabetes in his paternal grandmother; Hypertension in his mother.  Medications: Current Outpatient Medications  Medication Sig Dispense Refill  . buPROPion (WELLBUTRIN XL) 150 MG 24 hr tablet Take 1 tablet (150 mg total) by mouth every morning. 90 tablet 0  . dicyclomine (BENTYL) 10 MG capsule Take 1 capsule (10 mg total) by mouth 4 (four) times daily -  before meals and at bedtime. 120 capsule 1  . etanercept (ENBREL SURECLICK) 50 MG/ML injection Inject 50 mg into the skin.    Marland Kitchen sertraline (ZOLOFT) 100 MG tablet Take 1 tablet (100 mg total) by mouth daily. 90 tablet 0   No current facility-administered medications for this visit.    Allergies  Allergen Reactions  . Penicillins

## 2019-01-09 ENCOUNTER — Telehealth (INDEPENDENT_AMBULATORY_CARE_PROVIDER_SITE_OTHER): Payer: BLUE CROSS/BLUE SHIELD | Admitting: Family Medicine

## 2019-01-09 DIAGNOSIS — F411 Generalized anxiety disorder: Secondary | ICD-10-CM | POA: Diagnosis not present

## 2019-01-09 DIAGNOSIS — F331 Major depressive disorder, recurrent, moderate: Secondary | ICD-10-CM

## 2019-01-09 MED ORDER — SERTRALINE HCL 100 MG PO TABS: 100.0000 mg | ORAL_TABLET | Freq: Every day | ORAL | 3 refills | Status: DC

## 2019-01-09 MED ORDER — BUPROPION HCL ER (XL) 150 MG PO TB24: 150.0000 mg | ORAL_TABLET | ORAL | 3 refills | Status: DC

## 2019-01-09 NOTE — Progress Notes (Signed)
Virtual Visit  via Video Note  I connected with      Brandon Kelley by a video enabled telemedicine application and verified that I am speaking with the correct person using two identifiers.   I discussed the limitations of evaluation and management by telemedicine and the availability of in person appointments. The patient expressed understanding and agreed to proceed.  History of Present Illness: Brandon Kelley is a 33 y.o. male who would like to discuss follow-up mood.  Patient was seen previously in May for worsening anxiety and depression.  He had been controlled previously on Zoloft and Wellbutrin.  These were restarted and on recheck on June 8 was doing quite well.    Observations/Objective: There were no vitals taken for this visit. Wt Readings from Last 5 Encounters:  11/07/18 296 lb 6.4 oz (134.4 kg)  10/19/18 (!) 305 lb (138.3 kg)  05/08/18 290 lb (131.5 kg)  10/04/17 297 lb (134.7 kg)  05/03/17 (!) 306 lb (138.8 kg)   Exam: Appearance Normal Speech.  Psych alert and oriented normal speech thought process and affect.  Lab and Radiology Results No results found for this or any previous visit (from the past 72 hour(s)). No results found.   Assessment and Plan: 33 y.o. male with mood: Doing quite well.  Plan to continue current regimen.  Recheck yearly or sooner if needed.  Prescription written refill sent to pharmacy.  Patient to schedule nurse visit in near future for flu and Tdap vaccine.  PDMP not reviewed this encounter. No orders of the defined types were placed in this encounter.  No orders of the defined types were placed in this encounter.   Follow Up Instructions:    I discussed the assessment and treatment plan with the patient. The patient was provided an opportunity to ask questions and all were answered. The patient agreed with the plan and demonstrated an understanding of the instructions.   The patient was advised to call back or seek an  in-person evaluation if the symptoms worsen or if the condition fails to improve as anticipated.  Time: 15 minutes of intraservice time, with >22 minutes of total time during today's visit.      Historical information moved to improve visibility of documentation.  Past Medical History:  Diagnosis Date  . Ankylosing spondylitis ([place])    No past surgical history on file. Social History   Tobacco Use  . Smoking status: Never Smoker  . Smokeless tobacco: Current User    Types: Snuff  Substance Use Topics  . Alcohol use: Not Currently    Alcohol/week: 0.0 standard drinks   family history includes Cancer in his father; Diabetes in his paternal grandmother; Hypertension in his mother.  Medications: Current Outpatient Medications  Medication Sig Dispense Refill  . buPROPion (WELLBUTRIN XL) 150 MG 24 hr tablet Take 1 tablet (150 mg total) by mouth every morning. 90 tablet 0  . etanercept (ENBREL SURECLICK) 50 MG/ML injection Inject 50 mg into the skin.    Marland Kitchen sertraline (ZOLOFT) 100 MG tablet Take 1 tablet (100 mg total) by mouth daily. 90 tablet 0   No current facility-administered medications for this visit.    Allergies  Allergen Reactions  . Penicillins

## 2019-01-10 ENCOUNTER — Telehealth: Payer: Self-pay | Admitting: Family Medicine

## 2019-01-10 NOTE — Telephone Encounter (Signed)
-----   Message from Gregor Hams, MD sent at 01/09/2019  1:33 PM EDT ----- Regarding: nurse visit Please schedule patient for nurse visit for flu shot and Tdap vaccine next month.

## 2019-01-10 NOTE — Telephone Encounter (Signed)
Left voicemail with information below. Let patient know to call us back to schedule an office visit or a virtual.  ° °

## 2019-01-11 ENCOUNTER — Encounter: Payer: Self-pay | Admitting: Family Medicine

## 2019-03-20 ENCOUNTER — Emergency Department (HOSPITAL_BASED_OUTPATIENT_CLINIC_OR_DEPARTMENT_OTHER): Payer: BLUE CROSS/BLUE SHIELD

## 2019-03-20 ENCOUNTER — Encounter (HOSPITAL_BASED_OUTPATIENT_CLINIC_OR_DEPARTMENT_OTHER): Payer: Self-pay | Admitting: *Deleted

## 2019-03-20 ENCOUNTER — Emergency Department (HOSPITAL_BASED_OUTPATIENT_CLINIC_OR_DEPARTMENT_OTHER)
Admission: EM | Admit: 2019-03-20 | Discharge: 2019-03-20 | Disposition: A | Discharge status: Home / Self Care | Payer: BLUE CROSS/BLUE SHIELD | Attending: Emergency Medicine | Admitting: Emergency Medicine

## 2019-03-20 ENCOUNTER — Other Ambulatory Visit: Payer: Self-pay

## 2019-03-20 DIAGNOSIS — Z79899 Other long term (current) drug therapy: Secondary | ICD-10-CM | POA: Diagnosis not present

## 2019-03-20 DIAGNOSIS — N200 Calculus of kidney: Secondary | ICD-10-CM | POA: Diagnosis not present

## 2019-03-20 DIAGNOSIS — Z720 Tobacco use: Secondary | ICD-10-CM | POA: Insufficient documentation

## 2019-03-20 DIAGNOSIS — R109 Unspecified abdominal pain: Secondary | ICD-10-CM | POA: Diagnosis present

## 2019-03-20 LAB — COMPREHENSIVE METABOLIC PANEL
ALT: 30 U/L (ref 0–44)
AST: 24 U/L (ref 15–41)
Albumin: 4.2 g/dL (ref 3.5–5.0)
Alkaline Phosphatase: 114 U/L (ref 38–126)
Anion gap: 11 (ref 5–15)
BUN: 11 mg/dL (ref 6–20)
CO2: 24 mmol/L (ref 22–32)
Calcium: 9 mg/dL (ref 8.9–10.3)
Chloride: 100 mmol/L (ref 98–111)
Creatinine, Ser: 0.86 mg/dL (ref 0.61–1.24)
GFR calc Af Amer: >60 mL/min (ref >60)
GFR calc non Af Amer: >60 mL/min (ref >60)
Glucose, Bld: 186 mg/dL — ABNORMAL HIGH (ref 70–99)
Potassium: 4 mmol/L (ref 3.5–5.1)
Sodium: 135 mmol/L (ref 135–145)
Total Bilirubin: 1.2 mg/dL (ref 0.3–1.2)
Total Protein: 7.9 g/dL (ref 6.5–8.1)

## 2019-03-20 LAB — CBC
HCT: 49.1 % (ref 39.0–52.0)
Hemoglobin: 16.4 g/dL (ref 13.0–17.0)
MCH: 28.2 pg (ref 26.0–34.0)
MCHC: 33.4 g/dL (ref 30.0–36.0)
MCV: 84.4 fL (ref 80.0–100.0)
Platelets: 310 10*3/uL (ref 150–400)
RBC: 5.82 MIL/uL — ABNORMAL HIGH (ref 4.22–5.81)
RDW: 12.5 % (ref 11.5–15.5)
WBC: 10.5 10*3/uL (ref 4.0–10.5)
nRBC: 0 % (ref 0.0–0.2)

## 2019-03-20 LAB — URINALYSIS, MICROSCOPIC (REFLEX): RBC / HPF: >50 RBC/hpf (ref 0–5)

## 2019-03-20 LAB — URINALYSIS, ROUTINE W REFLEX MICROSCOPIC
Glucose, UA: NEGATIVE mg/dL
Ketones, ur: NEGATIVE mg/dL
Leukocytes,Ua: NEGATIVE
Nitrite: NEGATIVE
Protein, ur: 30 mg/dL — AB
Specific Gravity, Urine: >1.03 — ABNORMAL HIGH (ref 1.005–1.030)
pH: 6 (ref 5.0–8.0)

## 2019-03-20 LAB — LIPASE, BLOOD: Lipase: 34 U/L (ref 11–51)

## 2019-03-20 MED ORDER — SODIUM CHLORIDE 0.9 % IV BOLUS
1000.0000 mL | Freq: Once | INTRAVENOUS | Status: AC
  Administered 2019-03-20: 1000 mL via INTRAVENOUS

## 2019-03-20 MED ORDER — OXYCODONE-ACETAMINOPHEN 5-325 MG PO TABS: 1.0000 | ORAL_TABLET | Freq: Four times a day (QID) | ORAL | 0 refills | Status: DC | PRN

## 2019-03-20 MED ORDER — ONDANSETRON HCL 4 MG/2ML IJ SOLN
4.0000 mg | Freq: Once | INTRAMUSCULAR | Status: AC | PRN
  Administered 2019-03-20: 4 mg via INTRAVENOUS
  Filled 2019-03-20: qty 2

## 2019-03-20 MED ORDER — ONDANSETRON 4 MG PO TBDP: 4.0000 mg | ORAL_TABLET | ORAL | 0 refills | Status: DC | PRN

## 2019-03-20 MED ORDER — TAMSULOSIN HCL 0.4 MG PO CAPS
0.4000 mg | ORAL_CAPSULE | Freq: Once | ORAL | Status: AC
  Administered 2019-03-20: 0.4 mg via ORAL
  Filled 2019-03-20: qty 1

## 2019-03-20 MED ORDER — KETOROLAC TROMETHAMINE 30 MG/ML IJ SOLN
30.0000 mg | Freq: Once | INTRAMUSCULAR | Status: AC
  Administered 2019-03-20: 13:00:00 30 mg via INTRAVENOUS
  Filled 2019-03-20: qty 1

## 2019-03-20 MED ORDER — HYDROMORPHONE HCL 1 MG/ML IJ SOLN
1.0000 mg | Freq: Once | INTRAMUSCULAR | Status: AC
Start: 2019-03-20 — End: 2019-03-20
  Administered 2019-03-20: 1 mg via INTRAVENOUS
  Filled 2019-03-20: qty 1

## 2019-03-20 MED ORDER — ONDANSETRON HCL 4 MG/2ML IJ SOLN
4.0000 mg | Freq: Once | INTRAMUSCULAR | Status: AC
  Administered 2019-03-20: 4 mg via INTRAVENOUS
  Filled 2019-03-20: qty 2

## 2019-03-20 MED ORDER — OXYCODONE-ACETAMINOPHEN 5-325 MG PO TABS
2.0000 | ORAL_TABLET | Freq: Once | ORAL | Status: AC
  Administered 2019-03-20: 15:00:00 2 via ORAL
  Filled 2019-03-20: qty 2

## 2019-03-20 MED ORDER — TAMSULOSIN HCL 0.4 MG PO CAPS: 0.4000 mg | ORAL_CAPSULE | Freq: Every day | ORAL | 0 refills | Status: DC

## 2019-03-20 MED ORDER — IBUPROFEN 800 MG PO TABS: 800.0000 mg | ORAL_TABLET | Freq: Three times a day (TID) | ORAL | 0 refills | Status: AC

## 2019-03-20 NOTE — ED Triage Notes (Addendum)
Woke with right flank pain. Nausea and vomiting. Diarrhea this am. He is being treated for an inner ear infection for a week.

## 2019-03-20 NOTE — Discharge Instructions (Signed)
1.  You have a kidney stone on the right.  It is 2 mm.  It is a small enough to pass on its own.  Take ibuprofen every 8 hours for the next 2 days for pain control.  Take Percocet if needed for additional pain control.  Take Zofran to control nausea.  Take tamsulosin daily as prescribed.  Strain your urine. 2.  If you are having continuing or worsening pain that cannot be managed at home, if you develop fever, vomiting or other concerning symptoms return to the emergency department. 3.  Call [facility] to schedule a follow-up appointment. 4.  You have incidental findings on your CT scan that are unrelated to your kidney stone.  This needs to be reviewed by your family doctor and rechecked.  You may need a repeat CT scan or other testing.  Please follow-up with your doctor and have them review your results as soon as possible.

## 2019-03-20 NOTE — ED Provider Notes (Signed)
[facility] EMERGENCY DEPARTMENT Provider Note   CSN: [id number] Arrival date & time: 03/20/19  1042     History   Chief Complaint Chief Complaint  Patient presents with   Flank Pain    HPI Brandon Kelley is a 33 y.o. male.     HPI Patient reports he went to bed feeling well.  He awakened in the early morning hours to go the bathroom.  He reports he started noticed that he was getting some pain in his right kidney.  He reports that it really escalated after that.  He reports the pain is severe and he cannot get comfortable.  He reports it does radiate towards the right testicle.  He reports he has had several kidney stones in the past but it has never been this severe.  He has been vomiting due to pain.  He did not have any pre-existing fever, chills, cough, shortness of breath.  No pain burning urgency with urination leading up to this. Past Medical History:  Diagnosis Date   Ankylosing spondylitis [facility])     Patient Active Problem List   Diagnosis Date Noted   Tremor 10/14/2015   Vitamin D deficiency 06/18/2015   Major depression 06/06/2015   GAD (generalized anxiety disorder) 06/06/2015   Obese 06/06/2015   Other long term (current) drug therapy 10/04/2012   Ankylosing spondylitis (HCC) 03/02/2012    No past surgical history on file.      Home Medications    Prior to Admission medications   Medication Sig Start Date End Date Taking? Authorizing Provider  buPROPion (WELLBUTRIN XL) 150 MG 24 hr tablet Take 1 tablet (150 mg total) by mouth every morning. 01/09/19  Yes Rodolph Bong, MD  cephALEXin (KEFLEX) 500 MG capsule Take 500 mg by mouth 4 (four) times daily.   Yes [provider]  etanercept (ENBREL SURECLICK) 50 MG/ML injection Inject 50 mg into the skin. 12/18/13  Yes [provider]  sertraline (ZOLOFT) 100 MG tablet Take 1 tablet (100 mg total) by mouth daily. 01/09/19  Yes Rodolph Bong, MD  ibuprofen (ADVIL) 800 MG tablet  Take 1 tablet (800 mg total) by mouth 3 (three) times daily. 03/20/19   Arby Barrette, MD  ondansetron (ZOFRAN ODT) 4 MG disintegrating tablet Take 1 tablet (4 mg total) by mouth every 4 (four) hours as needed for nausea or vomiting. 03/20/19   Arby Barrette, MD  oxyCODONE-acetaminophen (PERCOCET) 5-325 MG tablet Take 1-2 tablets by mouth every 6 (six) hours as needed. 03/20/19   Arby Barrette, MD  tamsulosin (FLOMAX) 0.4 MG CAPS capsule Take 1 capsule (0.4 mg total) by mouth daily. 03/20/19   Arby Barrette, MD    Family History Family History  Problem Relation Age of Onset   Hypertension Mother    Cancer Father    Diabetes Paternal Grandmother     Social History Social History   Tobacco Use   Smoking status: Never Smoker   Smokeless tobacco: Current User    Types: Snuff  Substance Use Topics   Alcohol use: Not Currently    Alcohol/week: 0.0 standard drinks   Drug use: No     Allergies   Penicillins   Review of Systems Review of Systems 10 Systems reviewed and are negative for acute change except as noted in the HPI.   Physical Exam Updated Vital Signs BP 115/76    Pulse 76    Temp 97.7 F (36.5 C) (Oral)    Resp 18  Ht 6' (1.829 m)    Wt 136.1 kg    SpO2 91%    BMI 40.69 kg/m   Physical Exam Constitutional:      Comments: Patient is alert and appropriate.  Nontoxic.  He does appear to be in pain and is very nauseated.  HENT:     Head: Normocephalic and atraumatic.  Eyes:     Extraocular Movements: Extraocular movements intact.  Cardiovascular:     Rate and Rhythm: Normal rate and regular rhythm.  Pulmonary:     Effort: Pulmonary effort is normal.     Breath sounds: Normal breath sounds.  Abdominal:     General: There is no distension.     Palpations: Abdomen is soft.     Tenderness: There is no abdominal tenderness. There is no guarding.  Musculoskeletal: Normal range of motion.  Skin:    General: Skin is warm.     Comments: Patient is  slightly diaphoretic.  Neurological:     General: No focal deficit present.     Mental Status: He is oriented to person, place, and time.     Coordination: Coordination normal.  Psychiatric:        Mood and Affect: Mood normal.      ED Treatments / Results  Labs (all labs ordered are listed, but only abnormal results are displayed) Labs Reviewed  URINALYSIS, ROUTINE W REFLEX MICROSCOPIC - Abnormal; Notable for the following components:      Result Value   Color, Urine AMBER (*)    APPearance CLOUDY (*)    Specific Gravity, Urine >1.030 (*)    Hgb urine dipstick LARGE (*)    Bilirubin Urine SMALL (*)    Protein, ur 30 (*)    All other components within normal limits  COMPREHENSIVE METABOLIC PANEL - Abnormal; Notable for the following components:   Glucose, Bld 186 (*)    All other components within normal limits  CBC - Abnormal; Notable for the following components:   RBC 5.82 (*)    All other components within normal limits  URINALYSIS, MICROSCOPIC (REFLEX) - Abnormal; Notable for the following components:   Bacteria, UA RARE (*)    All other components within normal limits  LIPASE, BLOOD    EKG None  Radiology Ct Renal Stone Study  Result Date: 03/20/2019 CLINICAL DATA:  Right flank pain with nausea and vomiting EXAM: CT ABDOMEN AND PELVIS WITHOUT CONTRAST TECHNIQUE: Multidetector CT imaging of the abdomen and pelvis was performed following the standard protocol without oral or IV contrast. COMPARISON:  None. FINDINGS: Lower chest: On axial slice 1 series 4, there is a 2 mm nodular opacity in the medial segment of the right middle lobe. The lung bases otherwise are clear. Hepatobiliary: There are some occasional probable cysts within the liver, largest measuring approximately 1 cm. Liver otherwise appears unremarkable on this noncontrast enhanced study. There is a small gallstone within the gallbladder. The gallbladder wall is not appreciably thickened. There is no biliary  duct dilatation. Pancreas: There is no pancreatic mass or inflammatory focus. Spleen: No splenic lesions are evident. Adrenals/Urinary Tract: Adrenals bilaterally appear normal. There is no appreciable renal mass on either side. There is moderate hydronephrosis on the right. There is no appreciable hydronephrosis on the left. There are occasional tiny calculi in each kidney. There is a 2 mm calculus in the right ureter at the L3-4 level. No other ureteral calculi are evident on either side. Urinary bladder is midline. The urinary bladder wall thickness  is within normal limits for nearly empty state. Stomach/Bowel: There is no appreciable bowel wall or mesenteric thickening. There is no evident bowel obstruction. Terminal ileum appears unremarkable. There is lipomatous infiltration in the ileocecal valve. There is no free air or portal venous air. Vascular/Lymphatic: There is no abdominal aortic aneurysm. There is minimal calcification in the distal aorta. There is no adenopathy in the abdomen or pelvis by size criteria. There are subcentimeter mesenteric lymph nodes which are regarded as nonspecific. Reproductive: There is a small calcification in the leftward aspect of the prostate inferiorly. Prostate and seminal vesicles are normal in size and contour. There is no evident pelvic mass. Other: The appendix appears normal. There is mild sclerosis in the midline and slightly to the left of the midline in the mid abdominal region spanning an area of 8.3 x 4.7 x 6.5 cm. A few subcentimeter lymph nodes are noted in this region of suspected sclerosing mesenteritis. There is no abscess or ascites in the abdomen or pelvis. Musculoskeletal: There is diffuse syndesmophytic change throughout the visualized spine. There is symmetric sacroiliitis bilaterally. No fracture or seen. No blastic or lytic bone lesions. There are no intramuscular or abdominal wall lesions. IMPRESSION: 1. There is a 2 mm calculus in the right ureter at  the L3-4 level moderate hydronephrosis on the right. Tiny calculi are noted in each kidney, nonobstructing. 2. Mesenteric sclerosis slightly to the left of midline in the mid abdomen. 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 This is a finding that may warrant a follow-up CT in approximately 6 months to assess for stability or regression. Atypical neoplasm conceivably could present in this manner, although the appearance suggests benign sclerosing mesenteritis. 3. Scattered subcentimeter lymph nodes in the abdomen. No frank adenopathy by size criteria in the abdomen or pelvis. These lymph nodes must be considered nonspecific given their size. 4. Symmetric bilateral sacroiliitis and syndesmophytes throughout the visualized spine. This appearance is indicative of ankylosing spondylitis. No fracture evident. 5. No bowel obstruction. No abscess in the abdomen or pelvis. Appendix appears normal. 6. 2 mm nodular opacity in the right middle lobe. In this age group, this finding is almost certainly benign. Unless patient has a history of neoplasm, further surveillance of this small nodular lesion is not felt to be necessary. Electronically Signed   By: Lowella Grip III M.D.   On: 03/20/2019 12:37    Procedures Procedures (including critical care time)  Medications Ordered in ED Medications  ondansetron (ZOFRAN) injection 4 mg (4 mg Intravenous Given 03/20/19 1133)  HYDROmorphone (DILAUDID) injection 1 mg (1 mg Intravenous Given 03/20/19 1232)  ondansetron (ZOFRAN) injection 4 mg (4 mg Intravenous Given 03/20/19 1232)  ketorolac (TORADOL) 30 MG/ML injection 30 mg (30 mg Intravenous Given 03/20/19 1232)  sodium chloride 0.9 % bolus 1,000 mL ( Intravenous Stopped 03/20/19 1339)  oxyCODONE-acetaminophen (PERCOCET/ROXICET) 5-325 MG per tablet 2 tablet (2 tablets Oral Given 03/20/19 1456)  tamsulosin (FLOMAX) capsule 0.4 mg (0.4 mg Oral Given 03/20/19 1456)     Initial Impression / Assessment and Plan / ED Course    I have reviewed the triage vital signs and the nursing notes.  Pertinent labs & imaging results that were available during my care of the patient were reviewed by me and considered in my medical decision making (see chart for details).       Patient's pain was well controlled with Dilaudid 1 mg, Toradol and Zofran.  Kidney stone is 2 mm.  He does have hydronephrosis.  No signs of  associated UTI.  Will have the patient start ibuprofen, Percocet, Zofran and tamsulosin and strain urine.  Return precautions reviewed.  Patient has incidental findings of mesenteric sclerosis.  No sign this is contributing to the patient's acute symptoms.  Review of systems does not indicate any ongoing problems.  He does have ankylosing spondylitis history.  Patient is counseled on the need to follow-up with his PCP regarding CT findings which needs surveillance.  Final Clinical Impressions(s) / ED Diagnoses   Final diagnoses:  Kidney stone    ED Discharge Orders         Ordered    oxyCODONE-acetaminophen (PERCOCET) 5-325 MG tablet  Every 6 hours PRN     03/20/19 1454    tamsulosin (FLOMAX) 0.4 MG CAPS capsule  Daily     03/20/19 1454    ibuprofen (ADVIL) 800 MG tablet  3 times daily     03/20/19 1454    ondansetron (ZOFRAN ODT) 4 MG disintegrating tablet  Every 4 hours PRN     03/20/19 1454           Arby Barrette, MD 03/20/19 1502

## 2019-05-04 ENCOUNTER — Telehealth: Payer: BLUE CROSS/BLUE SHIELD | Admitting: Physician Assistant

## 2019-05-04 DIAGNOSIS — R059 Cough, unspecified: Secondary | ICD-10-CM

## 2019-05-04 DIAGNOSIS — R05 Cough: Secondary | ICD-10-CM

## 2019-05-04 DIAGNOSIS — Z20822 Contact with and (suspected) exposure to covid-19: Secondary | ICD-10-CM

## 2019-05-04 MED ORDER — BENZONATATE 100 MG PO CAPS: 100.0000 mg | ORAL_CAPSULE | Freq: Three times a day (TID) | ORAL | 0 refills | Status: DC | PRN

## 2019-05-04 NOTE — Progress Notes (Signed)
E-Visit for Corona Virus Screening   Your current symptoms could be consistent with the coronavirus.  Many health care providers can now test patients at their office but not all are.  [place] has multiple testing sites. For information on our COVID testing locations and hours go to [email or web address]  Please quarantine yourself while awaiting your test results.  We are enrolling you in our [facility] [place] . Daily you will receive a questionnaire within the [place] website. Our COVID 19 response team willl be monitoriing your responses daily. Please continue good preventive care measures, including:  frequent hand-washing, avoid touching your face, cover coughs/sneezes, stay out of crowds and keep a 6 foot distance from others.    COVID-19 is a respiratory illness with symptoms that are similar to the flu. Symptoms are typically mild to moderate, but there have been cases of severe illness and death due to the virus. The following symptoms may appear 2-14 days after exposure: . Fever . Cough . Shortness of breath or difficulty breathing . Chills . Repeated shaking with chills . Muscle pain . Headache . Sore throat . New loss of taste or smell . Fatigue . Congestion or runny nose . Nausea or vomiting . Diarrhea  If you develop fever/cough/breathlessness, please stay home for 10 days with improving symptoms and until you have had 24 hours of no fever (without taking a fever reducer).  Go to the nearest hospital ED for assessment if fever/cough/breathlessness are severe or illness seems like a threat to life.  It is vitally important that if you feel that you have an infection such as this virus or any other virus that you stay home and away from places where you may spread it to others.  You should avoid contact with people age 12 and older.   You should wear a mask or cloth face covering over your nose and mouth if you must be around other  people or animals, including pets (even at home). Try to stay at least 6 feet away from other people. This will protect the people around you.  You can use medication such as A prescription cough medication called Tessalon Perles 100 mg. You may take 1-2 capsules every 8 hours as needed for cough. I have prescribed this medication to your pharmacy.   You may also take acetaminophen (Tylenol) as needed for fever. You can take 1 to 2 tablets of Tylenol (350mg -1000mg  depending on the dose) every 6 hours as needed for pain or fever.  Do not exceed 4000 mg of Tylenol daily.    Reduce your risk of any infection by using the same precautions used for avoiding the common cold or flu:  Marland Kitchen Wash your hands often with soap and warm water for at least 20 seconds.  If soap and water are not readily available, use an alcohol-based hand sanitizer with at least 60% alcohol.  . If coughing or sneezing, cover your mouth and nose by coughing or sneezing into the elbow areas of your shirt or coat, into a tissue or into your sleeve (not your hands). . Avoid shaking hands with others and consider head nods or verbal greetings only. . Avoid touching your eyes, nose, or mouth with unwashed hands.  . Avoid close contact with people who are sick. . Avoid places or events with large numbers of people in one location, like concerts or sporting events. . Carefully consider travel plans you have or are making. . If you are planning any  travel outside or inside the [place], visit the CDC's Travelers' Health webpage for the latest health notices. . If you have some symptoms but not all symptoms, continue to monitor at home and seek medical attention if your symptoms worsen. . If you are having a medical emergency, call 911.  HOME CARE . Only take medications as instructed by your medical team. . Drink plenty of fluids and get plenty of rest. . A steam or ultrasonic humidifier can help if you have congestion.   GET HELP RIGHT AWAY  IF YOU HAVE EMERGENCY WARNING SIGNS** FOR COVID-19. If you or someone is showing any of these signs seek emergency medical care immediately. Call 911 or proceed to your closest emergency facility if: . You develop worsening high fever. . Trouble breathing . Bluish lips or face . Persistent pain or pressure in the chest . New confusion . Inability to wake or stay awake . You cough up blood. . Your symptoms become more severe  **This list is not all possible symptoms. Contact your medical provider for any symptoms that are sever or concerning to you.   MAKE SURE YOU   Understand these instructions.  Will watch your condition.  Will get help right away if you are not doing well or get worse.  Your e-visit answers were reviewed by a board certified advanced clinical practitioner to complete your personal care plan.  Depending on the condition, your plan could have included both over the counter or prescription medications.  If there is a problem please reply once you have received a response from your provider.  Your safety is important to [place].  If you have drug allergies check your prescription carefully.    You can use MyChart to ask questions about today's visit, request a non-urgent call back, or ask for a work or school excuse for 24 hours related to this e-Visit. If it has been greater than 24 hours you will need to follow up with your provider, or enter a new e-Visit to address those concerns. You will get an e-mail in the next two days asking about your experience.  I hope that your e-visit has been valuable and will speed your recovery. Thank you for using e-visits.   Greater than 5 minutes, yet less than 10 minutes of time have been spent researching, coordinating, and implementing care for this patient today.

## 2019-05-05 IMAGING — DX DG CHEST 2V
2 series · 2 of 2 positions shown · non-contrast
Comparison: None.

CLINICAL DATA: Cough and congestion

EXAM:
CHEST  2 VIEW

[chest pa]
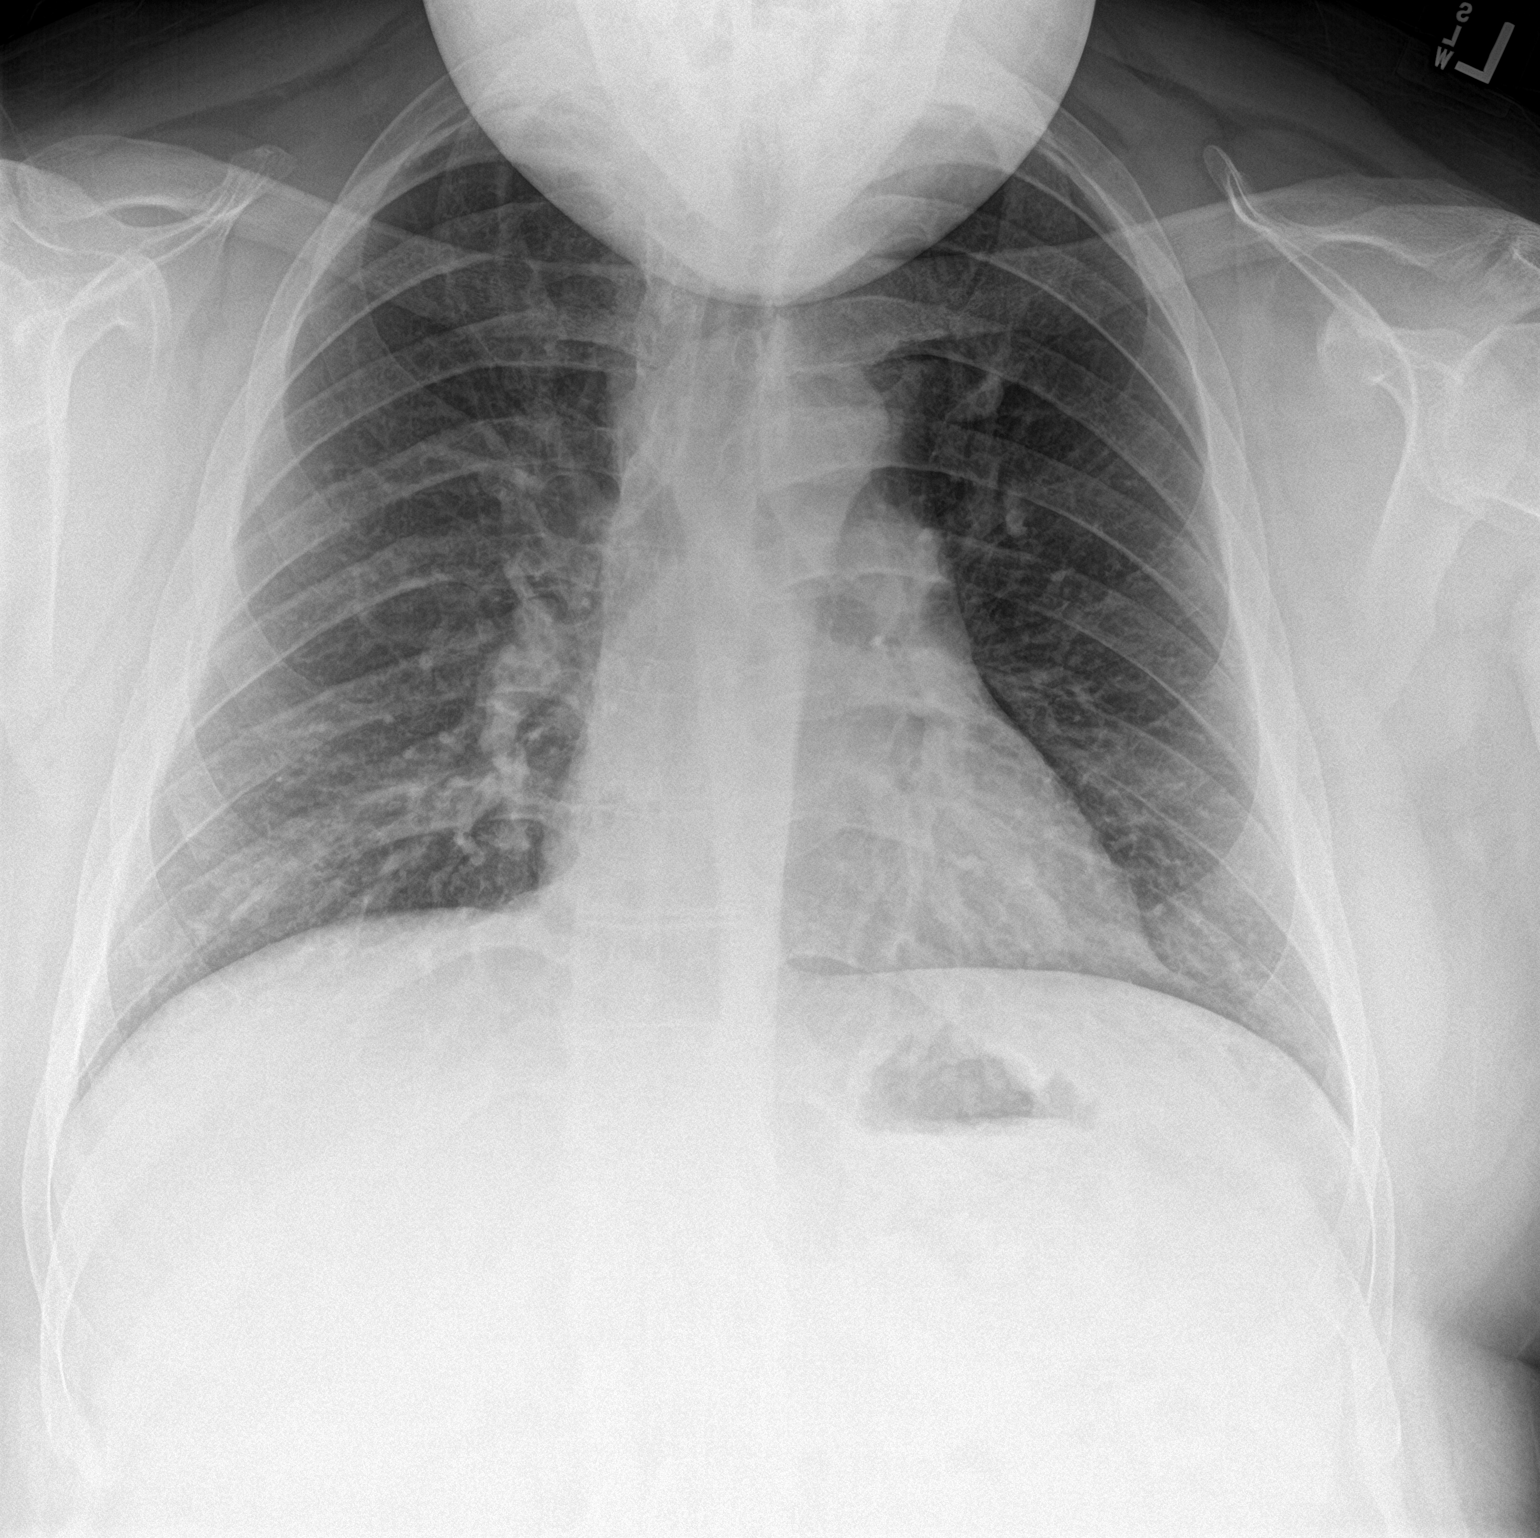

[chest lat]
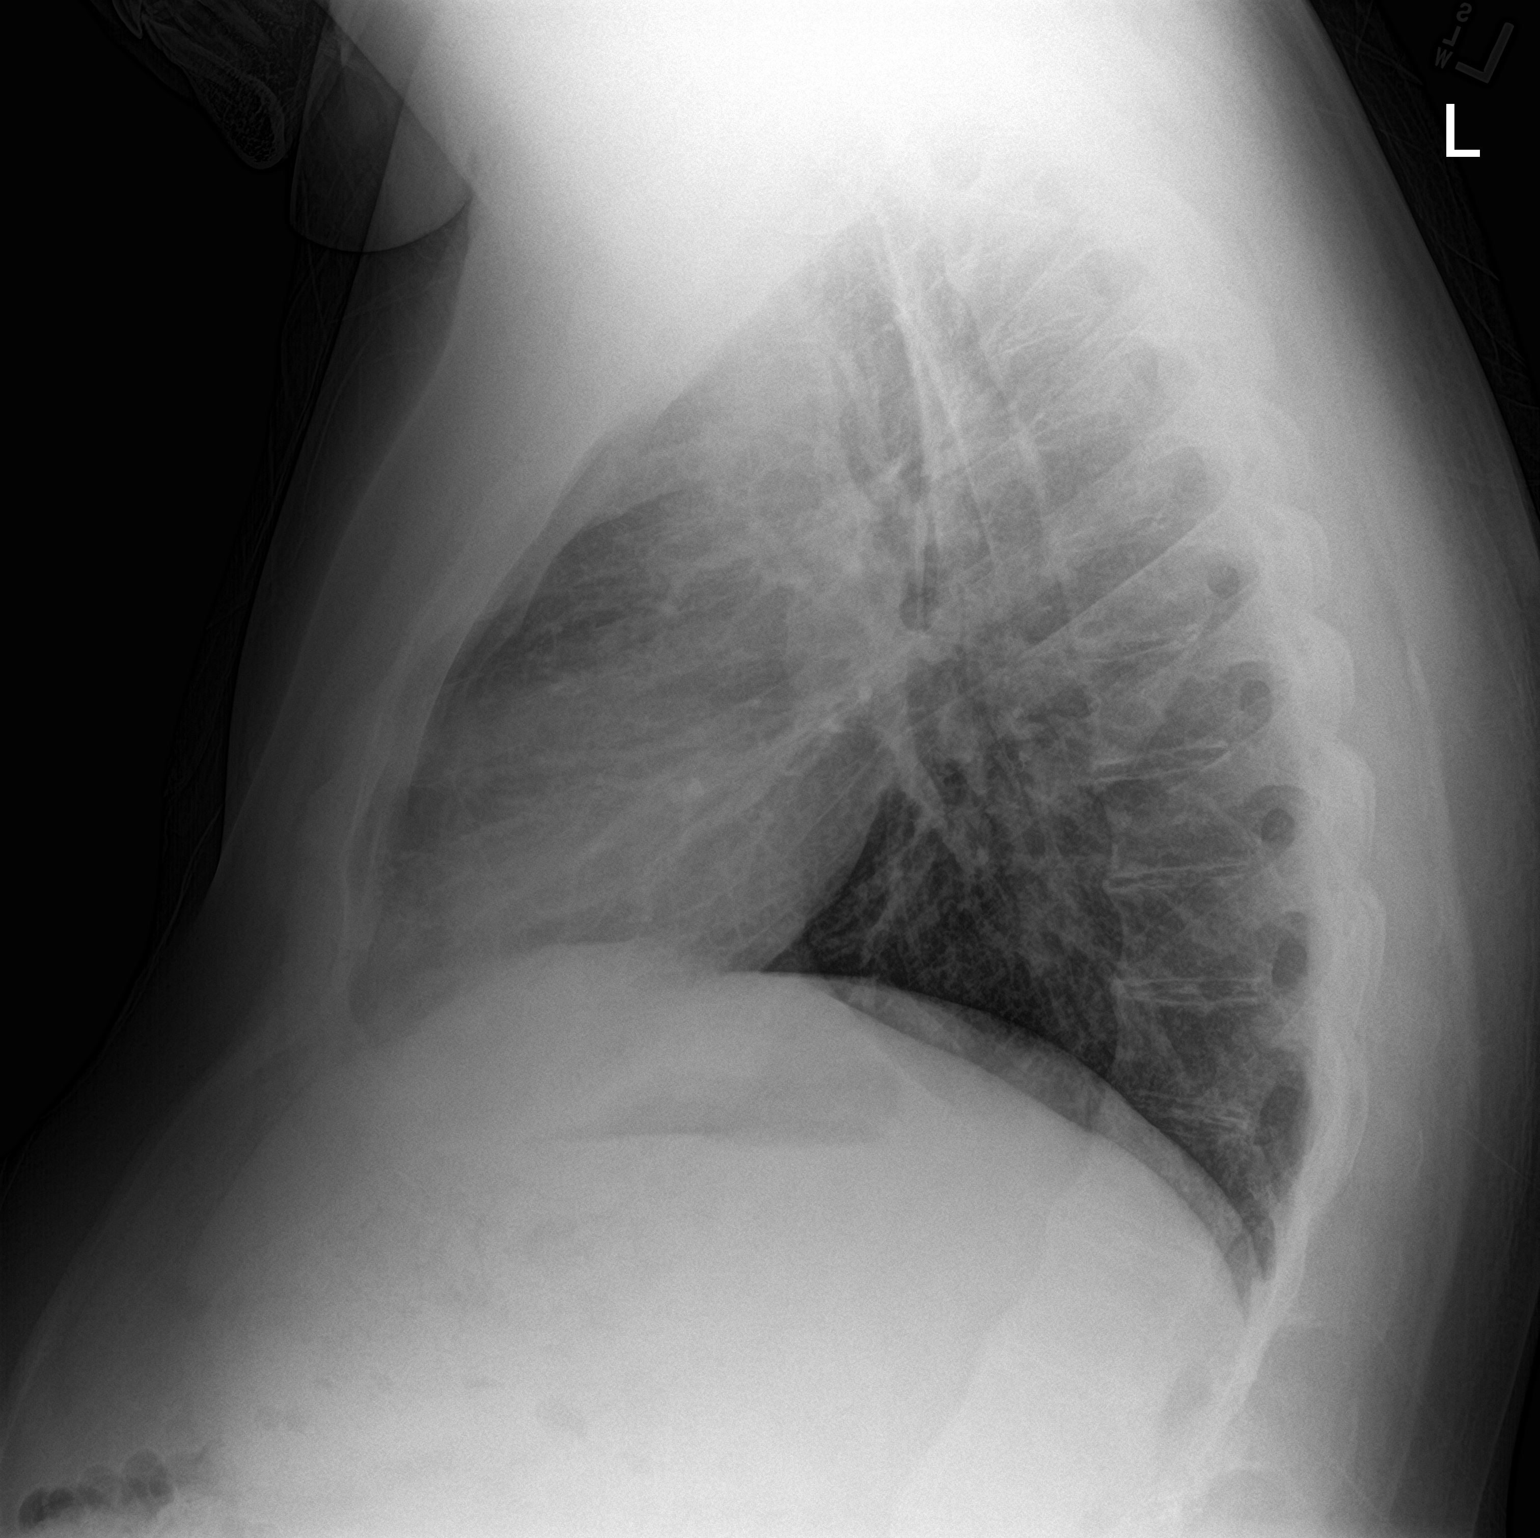

[2 of 2 positions shown; findings below may reference images not displayed]

FINDINGS: The patient's mandible obscures portions of the apices. There is no
apparent edema or consolidation. The heart size and pulmonary
vascularity are normal. No adenopathy. There is degenerative change
in the thoracic spine.
IMPRESSION: No edema or consolidation.

## 2020-04-19 ENCOUNTER — Other Ambulatory Visit: Payer: Self-pay | Admitting: Family Medicine

## 2020-04-30 ENCOUNTER — Other Ambulatory Visit: Payer: Self-pay | Admitting: Family Medicine

## 2021-03-21 IMAGING — CT CT RENAL STONE PROTOCOL
2 of 4 series · 15 of 46 positions shown, 17 images · non-contrast
Comparison: None.

CLINICAL DATA: Right flank pain with nausea and vomiting

EXAM:
CT ABDOMEN AND PELVIS WITHOUT CONTRAST
TECHNIQUE: Multidetector CT imaging of the abdomen and pelvis was performed
following the standard protocol without oral or IV contrast.

[Series 2: axial st · axial · 0.98mm/px · z∈[-564,-59]mm · 12 of 111 slices shown, 14 images]
[im 5/111  soft-tissue]
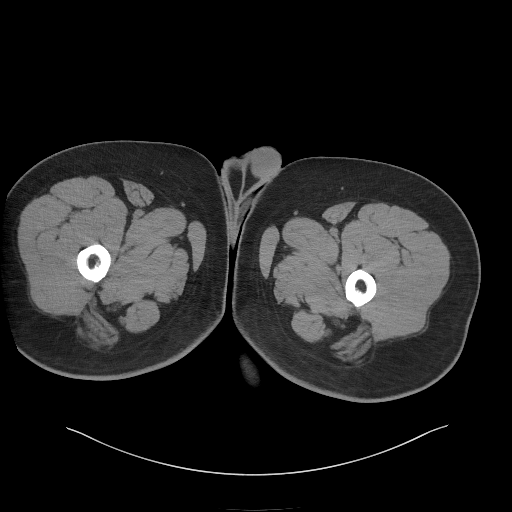
[im 5/111  bone]
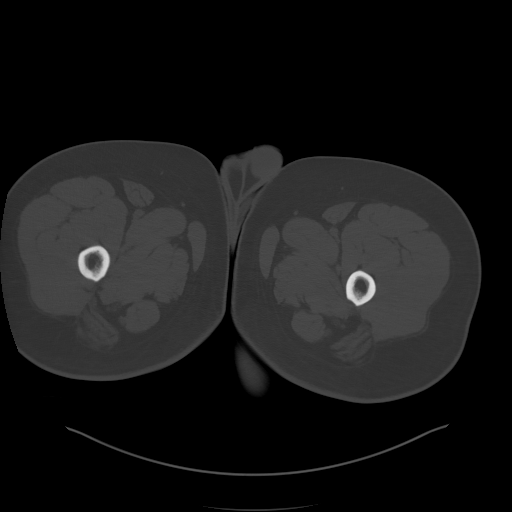
[im 14/111  soft-tissue]
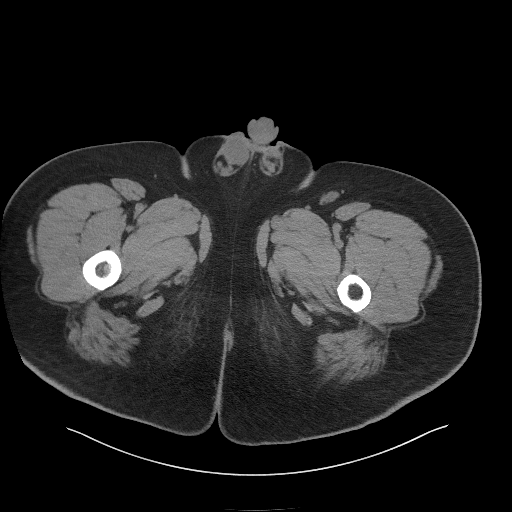
[im 23/111  soft-tissue]
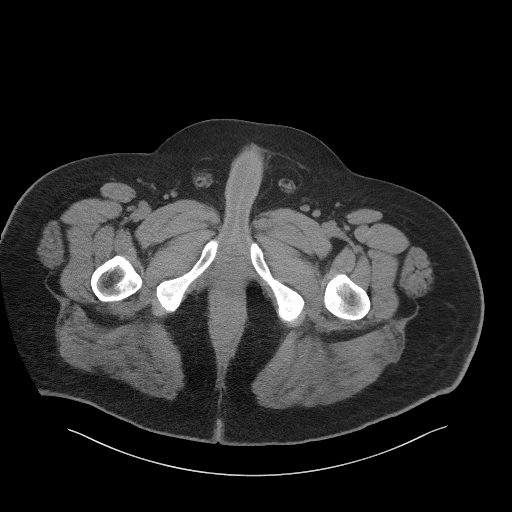
[im 33/111  soft-tissue]
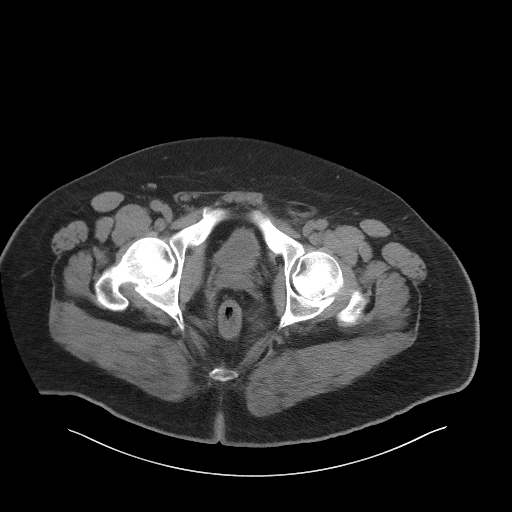
[im 42/111  soft-tissue]
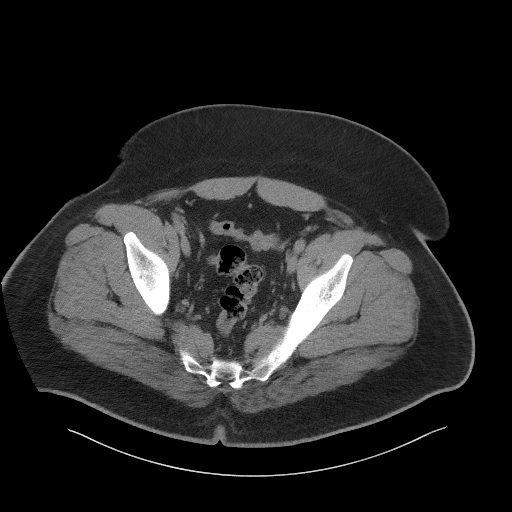
[im 51/111  soft-tissue]
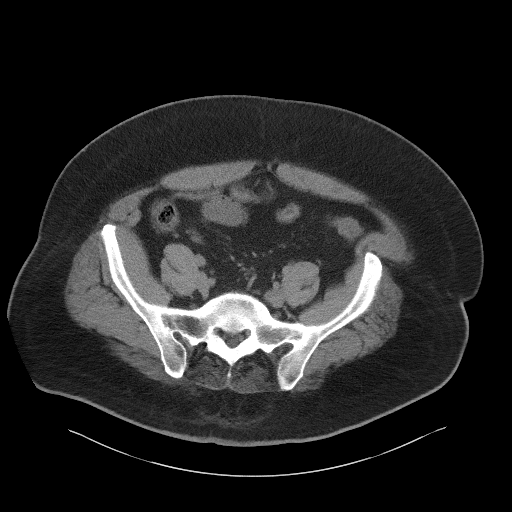
[im 60/111  soft-tissue]
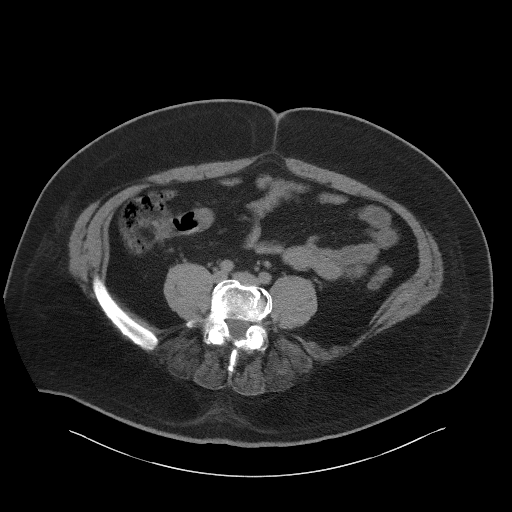
[im 69/111  soft-tissue]
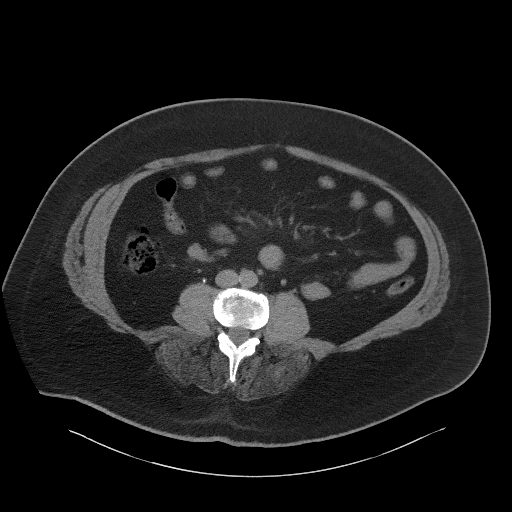
[im 78/111  soft-tissue]
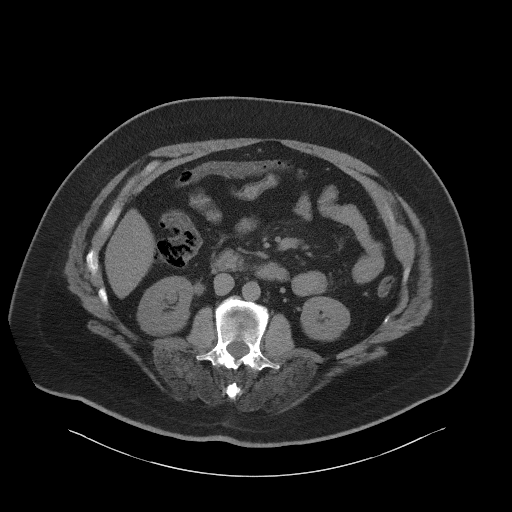
[im 78/111  bone]
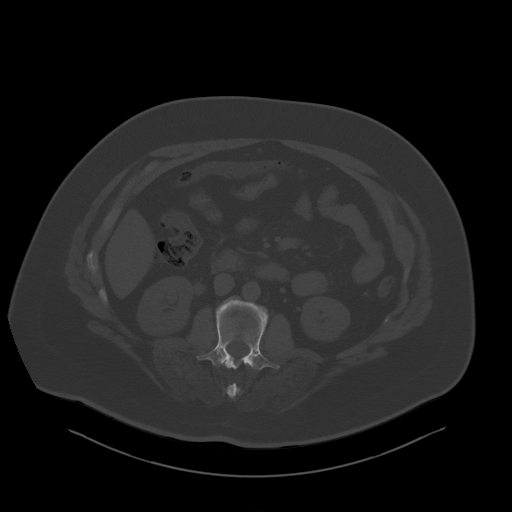
[im 88/111  soft-tissue]
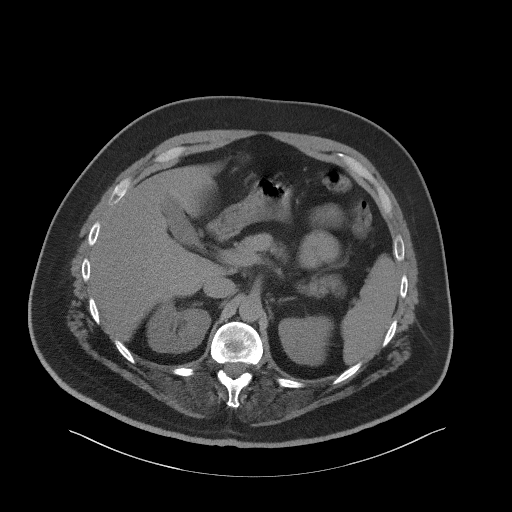
[im 97/111  soft-tissue]
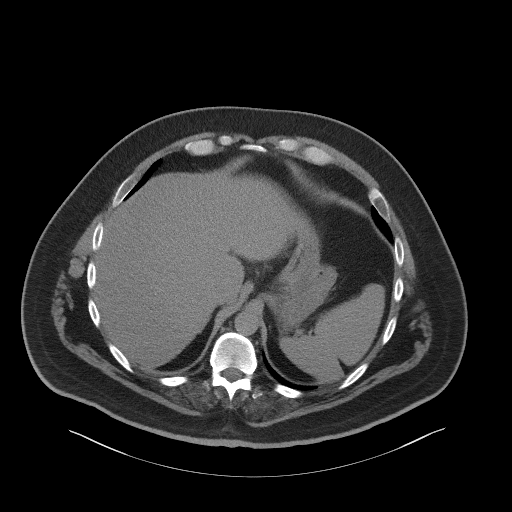
[im 106/111  soft-tissue]
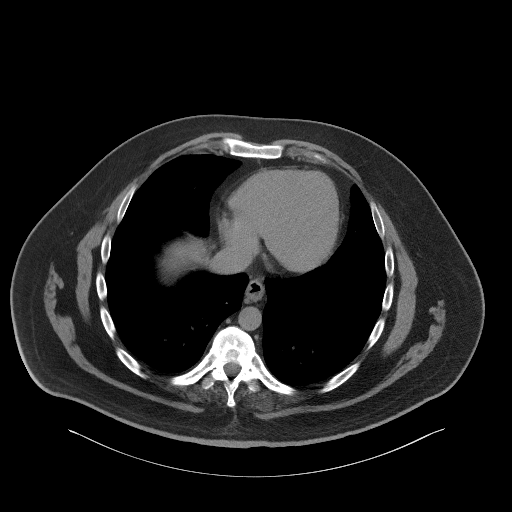

[Series 5: coronal st · coronal · 0.98mm/px · 3 of 121 slices shown]
[im 41/121  soft-tissue]
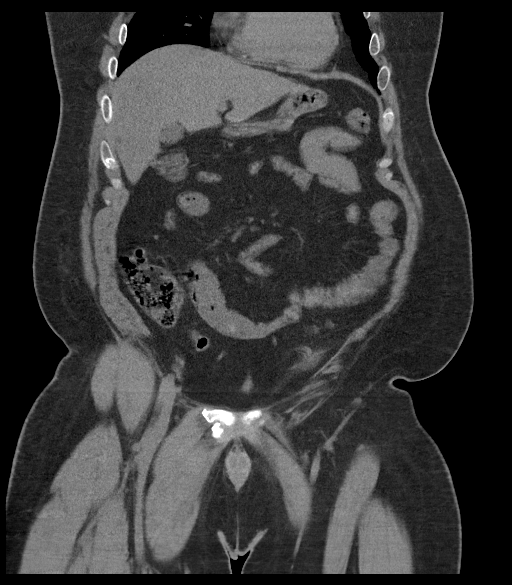
[im 54/121  soft-tissue]
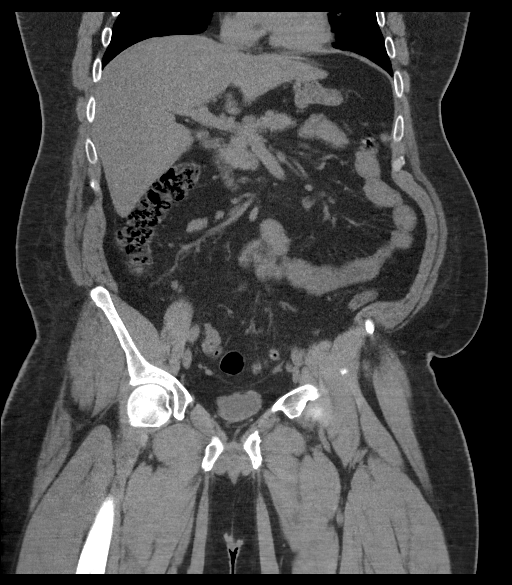
[im 67/121  soft-tissue]
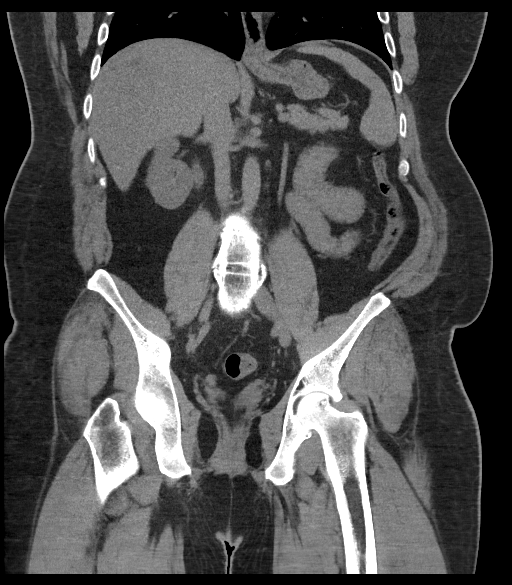

[15 of 46 positions shown; findings below may reference images not displayed]

FINDINGS: Lower chest: On axial slice 1 series 4, there is a 2 mm nodular
opacity in the medial segment of the right middle lobe. The lung
bases otherwise are clear.

Hepatobiliary: There are some occasional probable cysts within the
liver, largest measuring approximately 1 cm. Liver otherwise appears
unremarkable on this noncontrast enhanced study. There is a small
gallstone within the gallbladder. The gallbladder wall is not
appreciably thickened. There is no biliary duct dilatation.

Pancreas: There is no pancreatic mass or inflammatory focus.

Spleen: No splenic lesions are evident.

Adrenals/Urinary Tract: Adrenals bilaterally appear normal. There is
no appreciable renal mass on either side. There is moderate
hydronephrosis on the right. There is no appreciable hydronephrosis
on the left. There are occasional tiny calculi in each kidney. There
is a 2 mm calculus in the right ureter at the L3-4 level. No other
ureteral calculi are evident on either side. Urinary bladder is
midline. The urinary bladder wall thickness is within normal limits
for nearly empty state.

Stomach/Bowel: There is no appreciable bowel wall or mesenteric
thickening. There is no evident bowel obstruction. Terminal ileum
appears unremarkable. There is lipomatous infiltration in the
ileocecal valve. There is no free air or portal venous air.

Vascular/Lymphatic: There is no abdominal aortic aneurysm. There is
minimal calcification in the distal aorta. There is no adenopathy in
the abdomen or pelvis by size criteria. There are subcentimeter
mesenteric lymph nodes which are regarded as nonspecific.

Reproductive: There is a small calcification in the leftward aspect
of the prostate inferiorly. Prostate and seminal vesicles are normal
in size and contour. There is no evident pelvic mass.

Other: The appendix appears normal. There is mild sclerosis in the
midline and slightly to the left of the midline in the mid abdominal
region spanning an area of 8.3 x 4.7 x 6.5 cm. A few subcentimeter
lymph nodes are noted in this region of suspected sclerosing
mesenteritis. There is no abscess or ascites in the abdomen or
pelvis.

Musculoskeletal: There is diffuse syndesmophytic change throughout
the visualized spine. There is symmetric sacroiliitis bilaterally.
No fracture or seen. No blastic or lytic bone lesions. There are no
intramuscular or abdominal wall lesions.
IMPRESSION: 1. There is a 2 mm calculus in the right ureter at the L3-4 level
moderate hydronephrosis on the right. Tiny calculi are noted in each
kidney, nonobstructing.

2. Mesenteric sclerosis slightly to the left of midline in the mid
abdomen. Suspect sclerosing mesenteritis. This is a finding that may
warrant a follow-up CT in approximately 6 months to assess for
stability or regression. Atypical neoplasm conceivably could present
in this manner, although the appearance suggests benign sclerosing
mesenteritis.

3. Scattered subcentimeter lymph nodes in the abdomen. No frank
adenopathy by size criteria in the abdomen or pelvis. These lymph
nodes must be considered nonspecific given their size.

4. Symmetric bilateral sacroiliitis and syndesmophytes throughout
the visualized spine. This appearance is indicative of ankylosing
spondylitis. No fracture evident.

5. No bowel obstruction. No abscess in the abdomen or pelvis.
Appendix appears normal.

6. 2 mm nodular opacity in the right middle lobe. In this age group,
this finding is almost certainly benign. Unless patient has a
history of neoplasm, further surveillance of this small nodular
lesion is not felt to be necessary.

## 2023-07-04 ENCOUNTER — Encounter (HOSPITAL_BASED_OUTPATIENT_CLINIC_OR_DEPARTMENT_OTHER): Payer: Self-pay | Admitting: Emergency Medicine

## 2023-07-04 ENCOUNTER — Other Ambulatory Visit: Payer: Self-pay

## 2023-07-04 ENCOUNTER — Emergency Department (HOSPITAL_BASED_OUTPATIENT_CLINIC_OR_DEPARTMENT_OTHER): Payer: No Typology Code available for payment source

## 2023-07-04 ENCOUNTER — Inpatient Hospital Stay (HOSPITAL_BASED_OUTPATIENT_CLINIC_OR_DEPARTMENT_OTHER)
Admission: EM | Admit: 2023-07-04 | Discharge: 2023-07-06 | DRG: 728 | Disposition: A | Discharge status: Home / Self Care | Payer: No Typology Code available for payment source | Attending: Family Medicine | Admitting: Family Medicine

## 2023-07-04 DIAGNOSIS — E1165 Type 2 diabetes mellitus with hyperglycemia: Secondary | ICD-10-CM | POA: Diagnosis present

## 2023-07-04 DIAGNOSIS — Z880 Allergy status to penicillin: Secondary | ICD-10-CM

## 2023-07-04 DIAGNOSIS — Z792 Long term (current) use of antibiotics: Secondary | ICD-10-CM | POA: Diagnosis not present

## 2023-07-04 DIAGNOSIS — Z79899 Other long term (current) drug therapy: Secondary | ICD-10-CM

## 2023-07-04 DIAGNOSIS — N492 Inflammatory disorders of scrotum: Secondary | ICD-10-CM | POA: Diagnosis present

## 2023-07-04 DIAGNOSIS — E119 Type 2 diabetes mellitus without complications: Secondary | ICD-10-CM

## 2023-07-04 DIAGNOSIS — M459 Ankylosing spondylitis of unspecified sites in spine: Secondary | ICD-10-CM | POA: Diagnosis present

## 2023-07-04 DIAGNOSIS — L02215 Cutaneous abscess of perineum: Secondary | ICD-10-CM

## 2023-07-04 DIAGNOSIS — F17220 Nicotine dependence, chewing tobacco, uncomplicated: Secondary | ICD-10-CM | POA: Diagnosis present

## 2023-07-04 DIAGNOSIS — E876 Hypokalemia: Secondary | ICD-10-CM | POA: Diagnosis present

## 2023-07-04 DIAGNOSIS — E669 Obesity, unspecified: Secondary | ICD-10-CM | POA: Diagnosis present

## 2023-07-04 DIAGNOSIS — L03315 Cellulitis of perineum: Principal | ICD-10-CM

## 2023-07-04 DIAGNOSIS — R599 Enlarged lymph nodes, unspecified: Secondary | ICD-10-CM | POA: Diagnosis present

## 2023-07-04 DIAGNOSIS — Z6839 Body mass index (BMI) 39.0-39.9, adult: Secondary | ICD-10-CM | POA: Diagnosis not present

## 2023-07-04 DIAGNOSIS — R739 Hyperglycemia, unspecified: Secondary | ICD-10-CM

## 2023-07-04 DIAGNOSIS — D72829 Elevated white blood cell count, unspecified: Secondary | ICD-10-CM

## 2023-07-04 LAB — CBC WITH DIFFERENTIAL/PLATELET
Abs Immature Granulocytes: 0.03 10*3/uL (ref 0.00–0.07)
Basophils Absolute: 0 10*3/uL (ref 0.0–0.1)
Basophils Relative: 0 %
Eosinophils Absolute: 0.1 10*3/uL (ref 0.0–0.5)
Eosinophils Relative: 1 %
HCT: 44.9 % (ref 39.0–52.0)
Hemoglobin: 15.4 g/dL (ref 13.0–17.0)
Immature Granulocytes: 0 %
Lymphocytes Relative: 19 %
Lymphs Abs: 2.3 10*3/uL (ref 0.7–4.0)
MCH: 27.8 pg (ref 26.0–34.0)
MCHC: 34.3 g/dL (ref 30.0–36.0)
MCV: 81 fL (ref 80.0–100.0)
Monocytes Absolute: 0.9 10*3/uL (ref 0.1–1.0)
Monocytes Relative: 8 %
Neutro Abs: 8.8 10*3/uL — ABNORMAL HIGH (ref 1.7–7.7)
Neutrophils Relative %: 72 %
Platelets: 336 10*3/uL (ref 150–400)
RBC: 5.54 MIL/uL (ref 4.22–5.81)
RDW: 12 % (ref 11.5–15.5)
WBC: 12.1 10*3/uL — ABNORMAL HIGH (ref 4.0–10.5)
nRBC: 0 % (ref 0.0–0.2)

## 2023-07-04 LAB — URINALYSIS, MICROSCOPIC (REFLEX)

## 2023-07-04 LAB — COMPREHENSIVE METABOLIC PANEL
ALT: 14 U/L (ref 0–44)
AST: 14 U/L — ABNORMAL LOW (ref 15–41)
Albumin: 3.7 g/dL (ref 3.5–5.0)
Alkaline Phosphatase: 99 U/L (ref 38–126)
Anion gap: 9 (ref 5–15)
BUN: 10 mg/dL (ref 6–20)
CO2: 25 mmol/L (ref 22–32)
Calcium: 8.8 mg/dL — ABNORMAL LOW (ref 8.9–10.3)
Chloride: 98 mmol/L (ref 98–111)
Creatinine, Ser: 0.76 mg/dL (ref 0.61–1.24)
GFR, Estimated: >60 mL/min (ref >60)
Glucose, Bld: 189 mg/dL — ABNORMAL HIGH (ref 70–99)
Potassium: 3.4 mmol/L — ABNORMAL LOW (ref 3.5–5.1)
Sodium: 132 mmol/L — ABNORMAL LOW (ref 135–145)
Total Bilirubin: 1.4 mg/dL — ABNORMAL HIGH (ref 0.0–1.2)
Total Protein: 8.1 g/dL (ref 6.5–8.1)

## 2023-07-04 LAB — URINALYSIS, ROUTINE W REFLEX MICROSCOPIC
Bilirubin Urine: NEGATIVE
Glucose, UA: 250 mg/dL — AB
Hgb urine dipstick: NEGATIVE
Ketones, ur: NEGATIVE mg/dL
Leukocytes,Ua: NEGATIVE
Nitrite: NEGATIVE
Protein, ur: 30 mg/dL — AB
Specific Gravity, Urine: 1.015 (ref 1.005–1.030)
pH: 6.5 (ref 5.0–8.0)

## 2023-07-04 LAB — LACTIC ACID, PLASMA
Lactic Acid, Venous: 1.1 mmol/L (ref 0.5–1.9)
Lactic Acid, Venous: 0.9 mmol/L (ref 0.5–1.9)

## 2023-07-04 MED ORDER — SENNOSIDES-DOCUSATE SODIUM 8.6-50 MG PO TABS: 1.0000 | ORAL_TABLET | Freq: Every evening | ORAL | Status: DC | PRN

## 2023-07-04 MED ORDER — HYDROCODONE-ACETAMINOPHEN 5-325 MG PO TABS
1.0000 | ORAL_TABLET | ORAL | Status: DC | PRN
  Administered 2023-07-04: 2 via ORAL
  Administered 2023-07-05 (×2): 1 via ORAL
  Filled 2023-07-04: qty 2
  Filled 2023-07-04 (×2): qty 1

## 2023-07-04 MED ORDER — BISACODYL 5 MG PO TBEC: 5.0000 mg | DELAYED_RELEASE_TABLET | Freq: Every day | ORAL | Status: DC | PRN

## 2023-07-04 MED ORDER — VANCOMYCIN HCL IN DEXTROSE 1-5 GM/200ML-% IV SOLN
1000.0000 mg | Freq: Once | INTRAVENOUS | Status: AC
  Administered 2023-07-04: 1000 mg via INTRAVENOUS
  Filled 2023-07-04: qty 200

## 2023-07-04 MED ORDER — NICOTINE 7 MG/24HR TD PT24
7.0000 mg | MEDICATED_PATCH | Freq: Every day | TRANSDERMAL | Status: DC
  Administered 2023-07-04 – 2023-07-06 (×3): 7 mg via TRANSDERMAL
  Filled 2023-07-04 (×3): qty 1

## 2023-07-04 MED ORDER — ACETAMINOPHEN 650 MG RE SUPP: 650.0000 mg | Freq: Four times a day (QID) | RECTAL | Status: DC | PRN

## 2023-07-04 MED ORDER — LIDOCAINE HCL (PF) 1 % IJ SOLN
30.0000 mL | Freq: Once | INTRAMUSCULAR | Status: AC
  Administered 2023-07-04: 30 mL
  Filled 2023-07-04: qty 30

## 2023-07-04 MED ORDER — ACETAMINOPHEN 325 MG PO TABS
650.0000 mg | ORAL_TABLET | Freq: Four times a day (QID) | ORAL | Status: DC | PRN
Start: 2023-07-04 — End: 2023-07-07

## 2023-07-04 MED ORDER — VANCOMYCIN HCL 1750 MG/350ML IV SOLN
1750.0000 mg | Freq: Two times a day (BID) | INTRAVENOUS | Status: DC
  Administered 2023-07-05 – 2023-07-06 (×3): 1750 mg via INTRAVENOUS
  Filled 2023-07-04 (×3): qty 350

## 2023-07-04 MED ORDER — VANCOMYCIN HCL IN DEXTROSE 1-5 GM/200ML-% IV SOLN
1000.0000 mg | Freq: Once | INTRAVENOUS | Status: DC
  Filled 2023-07-04: qty 200

## 2023-07-04 MED ORDER — POTASSIUM CHLORIDE CRYS ER 20 MEQ PO TBCR
20.0000 meq | EXTENDED_RELEASE_TABLET | Freq: Once | ORAL | Status: AC
  Administered 2023-07-04: 20 meq via ORAL
  Filled 2023-07-04: qty 1

## 2023-07-04 MED ORDER — ONDANSETRON HCL 4 MG PO TABS: 4.0000 mg | ORAL_TABLET | Freq: Four times a day (QID) | ORAL | Status: DC | PRN

## 2023-07-04 MED ORDER — MORPHINE SULFATE (PF) 2 MG/ML IV SOLN
1.0000 mg | INTRAVENOUS | Status: DC | PRN
  Administered 2023-07-06: 1 mg via INTRAVENOUS
  Filled 2023-07-04: qty 1

## 2023-07-04 MED ORDER — ONDANSETRON HCL 4 MG/2ML IJ SOLN: 4.0000 mg | Freq: Four times a day (QID) | INTRAMUSCULAR | Status: DC | PRN

## 2023-07-04 MED ORDER — IOHEXOL 300 MG/ML  SOLN
100.0000 mL | Freq: Once | INTRAMUSCULAR | Status: AC | PRN
  Administered 2023-07-04: 100 mL via INTRAVENOUS

## 2023-07-04 MED ORDER — SODIUM CHLORIDE 0.9 % IV SOLN
1.0000 g | Freq: Three times a day (TID) | INTRAVENOUS | Status: DC
  Administered 2023-07-04 – 2023-07-06 (×6): 1 g via INTRAVENOUS
  Filled 2023-07-04 (×6): qty 20

## 2023-07-04 MED ORDER — VANCOMYCIN HCL IN DEXTROSE 1-5 GM/200ML-% IV SOLN
1000.0000 mg | Freq: Once | INTRAVENOUS | Status: AC
  Administered 2023-07-04: 1000 mg via INTRAVENOUS

## 2023-07-04 NOTE — Hospital Course (Signed)
Brandon Kelley is a 38 y.o. male with medical history significant for ankylosing spondylosis on Enbrel who is admitted with cellulitis and small abscess at the left scrotal base/perineal region.  S/p bedside I&D in the ED.  Urology consulted and patient started on empiric antibiotics.

## 2023-07-04 NOTE — ED Notes (Signed)
[facility] at bedside. Patient stable for transport. ?

## 2023-07-04 NOTE — Treatment Plan (Signed)
38 year old male who had a perianal abscess in the groin region that he picked out and is now spontaneously draining.  CT scan shows evidence of scrotal thickening that tracks up to the left inguinal scrotal region.  No evidence of gas on the CT scan.  ED provider called about it and said that they have an opening up a little bit more to help with the spontaneous drainage and collect a culture.  He is otherwise afebrile and hemodynamically stable.  He has been admitted to hospitalist service for IV antibiotics.  Recommend following up cultures and treating patient for 2 weeks with antibiotics.   Jerald Kief, MD, PhD [facility] Resident  [facility]

## 2023-07-04 NOTE — Progress Notes (Signed)
Plan of Care Note for accepted transfer   Patient: Brandon Kelley MRN: [id number]   DOA: 07/04/2023  Facility requesting transfer: [facility] Requesting Provider: Huston Foley, PA Reason for transfer: Left scrotal/perineal infection Facility course:  Patient is a 38 year old male w/ hx of ankylosing spondylitis on etanercept who came with concern of abscess to the left scrotum/perineal region.  He lanced it at home and it has been draining but he has had increased redness and pain to the area therefore came to the ED.  Labs notable for WBC 12.1, serum glucose 189.  Mildly tachycardic otherwise vital stable.  CT pelvis showed scrotal skin thickening and edema with infiltration of the left scrotal, perineal, and groin soft tissues consistent with cellulitis.  Reactive lymph nodes in the groin.  No loculated collection.  No soft tissue gas.  I&D performed in the ED.  Wound and blood cultures have been collected.  EDP spoke with on-call urology who recommended medical admission, continued antibiotics, and they will see in consultation.  Patient was started on vancomycin and meropenem.  Plan of care: The patient is accepted for admission to Med-surg  unit, at [facility].  Author: Darreld Mclean, MD 07/04/2023  Check [email or web address] for on-call coverage.  Nursing staff, Please call [facility] Admits & Consults System-Wide number on Amion as soon as patient's arrival, so appropriate admitting provider can evaluate the pt.

## 2023-07-04 NOTE — H&P (Signed)
History and Physical    Brandon Kelley ZOX:[id number] DOB: Aug 12, 1985 DOA: 07/04/2023  PCP: Patient, No Pcp Per  Patient coming from: Home  I have personally briefly reviewed patient's old medical records in [facility]  Chief Complaint: Concern for abscess in your scrotum  HPI: Brandon Kelley is a 38 y.o. male with medical history significant for ankylosing spondylitis on Enbrel who presented to the ED for evaluation of possible abscess in the left perineal/scrotal region.  Patient noted concern for developing abscess near his left scrotal/perineal region beginning 1 week ago.  Pain and swelling have slowly progressed over the week.  2 days ago he had his wife put a needle in the area to drain it.  He did have purulent and bloody output.  He says that he used alcohol swabs and peroxide to clean the area.  He had some initial relief but today symptoms again worsened therefore he went to the ED for further evaluation.  He has been having some chills at night recently but denies fevers.  No nausea, vomiting, abdominal pain.  He reports good urine output with some concentrated dark urine but no dysuria.  He has a history of ankylosing spondylitis and has been on and off Enbrel but recently started back within the last month.  [facility] ED Course  Labs/Imaging on admission: I have personally reviewed following labs and imaging studies.  The vitals showed BP 125/90, pulse 105, RR 18, temp 98.1 F, SpO2 99% on room air.  Labs showed sodium 132, potassium 3.4, bicarb 25, BUN 10, creatinine 0.76, serum glucose 189, WBC 12.1, hemoglobin 15.4, platelets 336,000, lactic acid 1.1 > 0.9.  UA showed negative nitrites, negative leukocytes, 0-5 RBCs and WBCs, rare bacteria.  Blood cultures in process.  I&D performed in ED, wound culture in process.  CT pelvis with contrast showed scrotal skin thickening and edema with infiltration in the left scrotal, perineal, and groin soft tissues  consistent with cellulitis.  Reactive lymph nodes in the groin.  No loculated collection.  No soft tissue gas seen.  Patient was given IV vancomycin and meropenem, oral K 20 mEq.  EDP discussed with urology who recommended medical admission, continue antibiotics, and they will see in consultation.  The hospitalist service was consulted to admit.  Review of Systems: All systems reviewed and are negative except as documented in history of present illness above.   Past Medical History:  Diagnosis Date   Ankylosing spondylitis (HCC)     History reviewed. No pertinent surgical history.  Social History:  reports that he has never smoked. His smokeless tobacco use includes snuff. He reports that he does not currently use alcohol. He reports that he does not use drugs.  Allergies  Allergen Reactions   Penicillins     Family History  Problem Relation Age of Onset   Hypertension Mother    Cancer Father    Diabetes Paternal Grandmother      Prior to Admission medications   Medication Sig Start Date End Date Taking? Authorizing Provider  benzonatate (TESSALON) 100 MG capsule Take 1 capsule (100 mg total) by mouth 3 (three) times daily as needed for cough. 05/04/19   Fawze, Mina A, PA-C  buPROPion (WELLBUTRIN XL) 150 MG 24 hr tablet Take 1 tablet (150 mg total) by mouth every morning. SCHEDULE WITH NEW PCP 04/22/20   Rodolph Bong, MD  cephALEXin (KEFLEX) 500 MG capsule Take 500 mg by mouth 4 (four) times daily.    [provider]  etanercept (ENBREL SURECLICK) 50 MG/ML injection Inject 50 mg into the skin. 12/18/13   [provider]  ibuprofen (ADVIL) 800 MG tablet Take 1 tablet (800 mg total) by mouth 3 (three) times daily. 03/20/19   Arby Barrette, MD  ondansetron (ZOFRAN ODT) 4 MG disintegrating tablet Take 1 tablet (4 mg total) by mouth every 4 (four) hours as needed for nausea or vomiting. 03/20/19   Arby Barrette, MD  oxyCODONE-acetaminophen (PERCOCET) 5-325 MG  tablet Take 1-2 tablets by mouth every 6 (six) hours as needed. 03/20/19   Arby Barrette, MD  sertraline (ZOLOFT) 100 MG tablet Take 1 tablet (100 mg total) by mouth daily. SCHEDULE WITH NEW PCP 04/22/20   Rodolph Bong, MD  tamsulosin (FLOMAX) 0.4 MG CAPS capsule Take 1 capsule (0.4 mg total) by mouth daily. 03/20/19   Arby Barrette, MD    Physical Exam: Vitals:   07/04/23 1645 07/04/23 1824 07/04/23 1830 07/04/23 1946  BP: 111/75  107/72 118/76  Pulse: 94 87 87 88  Resp:   17 18  Temp:    99.1 F (37.3 C)  TempSrc:      SpO2: 94% 100% 100% 97%  Weight:      Height:       Constitutional: Resting in bed, NAD, calm, comfortable Eyes: EOMI, lids and conjunctivae normal ENMT: Mucous membranes are moist. Posterior pharynx clear of any exudate or lesions.Normal dentition.  Neck: normal, supple, no masses. Respiratory: clear to auscultation bilaterally, no wheezing, no crackles. Normal respiratory effort. No accessory muscle use.  Cardiovascular: Regular rate and rhythm, no murmurs / rubs / gallops. No extremity edema. 2+ pedal pulses. Abdomen: no tenderness, no masses palpated. GU: Indurated area at the base of the left lateral scrotum with erythema, s/p I&D wound with some purulent and bloody discharge.  Gauze in place.  No urethral discharge. Musculoskeletal: no clubbing / cyanosis. No joint deformity upper and lower extremities. Good ROM, no contractures. Normal muscle tone.  Skin: Indurated area at the base of the left lateral scrotum with erythema, s/p I&D wound with some purulent and bloody discharge Neurologic:  Sensation intact. Strength 5/5 in all 4.  Psychiatric: Alert and oriented x 3. Normal mood.   EKG: Not performed.  Assessment/Plan Principal Problem:   Cellulitis of scrotum Active Problems:   Ankylosing spondylitis (HCC)   Brandon Kelley is a 38 y.o. male with medical history significant for ankylosing spondylosis on Enbrel who is admitted with cellulitis and  small abscess at the left scrotal base/perineal region.  S/p bedside I&D in the ED.  Urology consulted and patient started on empiric antibiotics.  Assessment and Plan: Cellulitis of the left scrotum/perineal region with small abscess: I&D performed by EDP.  Blood and wound cultures collected.  CT without loculated collection or soft tissue gas.  UA negative for UTI.  Patient was started on IV vancomycin and meropenem in the ED. -Continue antibiotics and narrow as able -Follow wound and blood cultures -Urology to follow  Hypokalemia: Supplement given.  Hyperglycemia: Mild, could be secondary to infection although patient has previous serum glucoses in the 270s.  He has no previous diagnosis of diabetes.  Will check A1c.  Ankylosing spondylitis: On active treatment with Enbrel.  Follows with [facility] rheumatology Dr. Sharmon Revere.  Advised to hold Enbrel until infection clears up.   DVT prophylaxis: SCDs Start: 07/04/23 2055 Code Status: Full code Family Communication: Discussed with patient, he has discussed with family Disposition Plan: From home and likely discharge  to home pending clinical progress Consults called: Urology Severity of Illness: The appropriate patient status for this patient is INPATIENT. Inpatient status is judged to be reasonable and necessary in order to provide the required intensity of service to ensure the patient's safety. The patient's presenting symptoms, physical exam findings, and initial radiographic and laboratory data in the context of their chronic comorbidities is felt to place them at high risk for further clinical deterioration. Furthermore, it is not anticipated that the patient will be medically stable for discharge from the hospital within 2 midnights of admission.   * I certify that at the point of admission it is my clinical judgment that the patient will require inpatient hospital care spanning beyond 2 midnights from the point of admission due to high  intensity of service, high risk for further deterioration and high frequency of surveillance required.Darreld Mclean MD [facility]  If 7PM-7AM, please contact night-coverage [email or web address]  07/04/2023, 9:02 PM

## 2023-07-04 NOTE — ED Provider Notes (Signed)
I provided a substantive portion of the care of this patient.  I personally made/approved the management plan for this patient and take responsibility for the patient management.     Patient with abscess just lateral to the left scrotal area for a week.  Has been draining some spontaneously.  Patient went to urgent care today and sent in for concerns for significant abscess.  Patient states that he does have discomfort in the scrotum and it feels thickened.  Patient without fever here.  Blood sugar is elevated here at 180 no known history of diabetes could be secondary to the infection.  Patient does have a history of ankylosing spondylitis and is on immunotherapy.  Patient is on Enbrel.  Patient's only doctor is a rheumatologist.  CT scan was done which shows some cellulitis involving the scrotum but no deep space infection in the scrotum at this point in time.  No gas.  Concern would be avoiding the development of Fournier's gangrene.  Based on this we will treat him with vancomycin independent.  Very spoke to on-call urology they will see him in consultation to be a medicine admission.  In addition to the antibiotics will get blood cultures and will get a lactic acid.  Patient's vital signs currently not consistent with sepsis other than he was a little tachycardic 105 but he is currently 88 and blood pressure is 118/84 respirations 17.  Patient CBC white count is 12.1 platelets are good at 336 patient's blood sugars 189 CO2 is 25.  We will also go an I&D that area little better and try to get a wick in place.  CRITICAL CARE Performed by: Vanetta Mulders Total critical care time: 45 minutes Critical care time was exclusive of separately billable procedures and treating other patients. Critical care was necessary to treat or prevent imminent or life-threatening deterioration. Critical care was time spent personally by me on the following activities: development of treatment plan with patient  and/or surrogate as well as nursing, discussions with consultants, evaluation of patient's response to treatment, examination of patient, obtaining history from patient or surrogate, ordering and performing treatments and interventions, ordering and review of laboratory studies, ordering and review of radiographic studies, pulse oximetry and re-evaluation of patient's condition.    Vanetta Mulders, MD 07/04/23 [phone]

## 2023-07-04 NOTE — Progress Notes (Signed)
Pharmacy Antibiotic Note  Brandon Kelley is a 37 y.o. male admitted on 07/04/2023 with cellulitis of the left scrotum/perineal region with small abscess s/p I&D in ED. Pharmacy has been consulted for Vancomycin dosing.  Plan: Vancomycin 2g IV given in the ED. Continue with Vancomycin 1750mg  IV q12h to keep AUC 400-550. Vancomycin levels at steady state, as indicated. Meropenem per MD. Monitor renal function, cultures, clinical course.   Height: 5\' 10"  (177.8 cm) Weight: 124.7 kg (275 lb) IBW/kg (Calculated) : 73  Temp (24hrs), Avg:98.6 F (37 C), Min:98.1 F (36.7 C), Max:99.1 F (37.3 C)  Recent Labs  Lab 07/04/23 1604 07/04/23 1745  WBC 12.1*  --   CREATININE 0.76  --   LATICACIDVEN 1.1 0.9    Estimated Creatinine Clearance: 169.2 mL/min (by C-G formula based on SCr of 0.76 mg/dL).    Allergies  Allergen Reactions   Penicillins     Antimicrobials this admission: 2/2 Meropenem >> 2/2 Vancomycin >>  Dose adjustments this admission: --  Microbiology results: 2/2 BCx:  2/2 Perineal culture:   Thank you for allowing pharmacy to be a part of this patient's care.   Greer Pickerel, PharmD, BCPS Clinical Pharmacist 07/04/2023 9:18 PM

## 2023-07-04 NOTE — ED Notes (Signed)
 Called [facility] for transport.

## 2023-07-04 NOTE — ED Notes (Signed)
Report given to Jacques [facility] - eta 20 minutes

## 2023-07-04 NOTE — Consult Note (Signed)
I have been asked to see the patient by Dr. Darreld Mclean, for evaluation and management of Scrotal abscess.  History of present illness: 62 M w/ hx of ankylosing spondylitis presented to the ER with complaints of abscess in the perineal region. Patient opened it somewhat slightly at home and noted purulent drainage. CT shows small fluid collection at the left posterior scrotum, no gas present. I&D performed at the bedside by ER. Patient started on meropenem and vancomycin by the ER. Blood cultures pending.   Per patient he started having symptoms 1 week ago. They included chills and scrotal swelling. He denies N/V/F.   HE has no hx of GU surgery and denies anything similar occurring prior to this episode.    Review of systems: A 12 point comprehensive review of systems was obtained and is negative unless otherwise stated in the history of present illness.  Patient Active Problem List   Diagnosis Date Noted   Cellulitis of scrotum 07/04/2023   Tremor 10/14/2015   Vitamin D deficiency 06/18/2015   Major depression 06/06/2015   GAD (generalized anxiety disorder) 06/06/2015   Obese 06/06/2015   Other long term (current) drug therapy 10/04/2012   Ankylosing spondylitis (HCC) 03/02/2012    No current facility-administered medications on file prior to encounter.   Current Outpatient Medications on File Prior to Encounter  Medication Sig Dispense Refill   benzonatate (TESSALON) 100 MG capsule Take 1 capsule (100 mg total) by mouth 3 (three) times daily as needed for cough. 20 capsule 0   buPROPion (WELLBUTRIN XL) 150 MG 24 hr tablet Take 1 tablet (150 mg total) by mouth every morning. SCHEDULE WITH NEW PCP 90 tablet 0   cephALEXin (KEFLEX) 500 MG capsule Take 500 mg by mouth 4 (four) times daily.     etanercept (ENBREL SURECLICK) 50 MG/ML injection Inject 50 mg into the skin.     ibuprofen (ADVIL) 800 MG tablet Take 1 tablet (800 mg total) by mouth 3 (three) times daily. 21 tablet 0    ondansetron (ZOFRAN ODT) 4 MG disintegrating tablet Take 1 tablet (4 mg total) by mouth every 4 (four) hours as needed for nausea or vomiting. 20 tablet 0   oxyCODONE-acetaminophen (PERCOCET) 5-325 MG tablet Take 1-2 tablets by mouth every 6 (six) hours as needed. 20 tablet 0   sertraline (ZOLOFT) 100 MG tablet Take 1 tablet (100 mg total) by mouth daily. SCHEDULE WITH NEW PCP 90 tablet 0   tamsulosin (FLOMAX) 0.4 MG CAPS capsule Take 1 capsule (0.4 mg total) by mouth daily. 30 capsule 0    Past Medical History:  Diagnosis Date   Ankylosing spondylitis (HCC)     History reviewed. No pertinent surgical history.  Social History   Tobacco Use   Smoking status: Never   Smokeless tobacco: Current    Types: Snuff  Vaping Use   Vaping status: Never Used  Substance Use Topics   Alcohol use: Not Currently    Alcohol/week: 0.0 standard drinks of alcohol   Drug use: No    Family History  Problem Relation Age of Onset   Hypertension Mother    Cancer Father    Diabetes Paternal Grandmother     PE: Vitals:   07/04/23 1556 07/04/23 1645 07/04/23 1824 07/04/23 1830  BP: 118/84 111/75  107/72  Pulse: 88 94 87 87  Resp: 17   17  Temp: 98.5 F (36.9 C)     TempSrc: Oral     SpO2: 100% 94% 100% 100%  Weight:      Height:       Patient appears to be in no acute distress  patient is alert and oriented x3 Atraumatic normocephalic head No cervical or supraclavicular lymphadenopathy appreciated No increased work of breathing, no audible wheezes/rhonchi Regular sinus rhythm/rate Abdomen is soft, nontender, nondistended, no CVA or suprapubic tenderness GU: no tenderness to penis or mons pubis, no rashes or skin changes to penis. The scrotum is has a noted stab incision at the left lower base draining purulent fluid. There is some small packing over the opening but the opening is not large enough to place finger inside. There is no eschar and some minor induration in the that area. The  remainder of the scortum is non fluctuant or indurated. The medial thighs and perineum have no tenderness or concerns for infection.   Recent Labs    07/04/23 1604  WBC 12.1*  HGB 15.4  HCT 44.9   Recent Labs    07/04/23 1604  NA 132*  K 3.4*  CL 98  CO2 25  GLUCOSE 189*  BUN 10  CREATININE 0.76  CALCIUM 8.8*   No results for input(s): "LABPT", "INR" in the last 72 hours. No results for input(s): "LABURIN" in the last 72 hours. No results found for this or any previous visit.  Imaging:CT IMPRESSION: Scrotal skin thickening and edema with infiltration in the left scrotal, peroneal, and groin soft tissues consistent with cellulitis. Reactive lymph nodes in the groin. No loculated collection. No soft tissue gas.    Imp: 16 M w/ small left scrotal base abscess s/p drainage in the ER. The area is draining but likely needs to be opened up more to facilitate improved drainage and packing. Urology will plan to stop by later today to open the incision up more.   Recommendations: - continue broad spectrum abx (vanc and meropenem) until cultures result - urology will plan to come by later today to open the incision up more - if Cultures return as polymicrobial or non diagnostic please transition the patient to bactrim with 24 hour trial prior to discharge  - urology will continue to follow    Thank you for involving me in this patient's care, I will continue to follow along. Please page with any further questions or concerns. Adonis Brook

## 2023-07-04 NOTE — ED Provider Notes (Signed)
[facility] Provider Note   CSN: [id number] Arrival date & time: 07/04/23  1354     History  Chief Complaint  Patient presents with   Abscess    Brandon Kelley is a 38 y.o. male.  Patient is a 38 year old male with a past medical history of ankylosing spondylitis who presents to the emergency department with a chief complaint of an abscess in his perineal region.  He notes that this has been present for approximate the past week.  Patient notes that he did manage to open the area at home and notes that it has been draining a purulent discharge since that time.  Patient notes that he has had no associated fever, chills, abdominal pain, nausea, vomiting, diarrhea.  He denies any associated dysuria, hematuria, testicular pain or tenderness.  He denies any known history of diabetes.  He notes that he was evaluated in urgent care just prior to arrival and sent to the emergency department for further evaluation.  He denies any history of similar symptoms in the past.  He does note that he has continued to move his bowels at his baseline.   Abscess      Home Medications Prior to Admission medications   Medication Sig Start Date End Date Taking? Authorizing Provider  benzonatate (TESSALON) 100 MG capsule Take 1 capsule (100 mg total) by mouth 3 (three) times daily as needed for cough. 05/04/19   Fawze, Mina A, PA-C  buPROPion (WELLBUTRIN XL) 150 MG 24 hr tablet Take 1 tablet (150 mg total) by mouth every morning. SCHEDULE WITH NEW PCP 04/22/20   Rodolph Bong, MD  cephALEXin (KEFLEX) 500 MG capsule Take 500 mg by mouth 4 (four) times daily.    [provider]  etanercept (ENBREL SURECLICK) 50 MG/ML injection Inject 50 mg into the skin. 12/18/13   [provider]  ibuprofen (ADVIL) 800 MG tablet Take 1 tablet (800 mg total) by mouth 3 (three) times daily. 03/20/19   Arby Barrette, MD  ondansetron (ZOFRAN ODT) 4 MG disintegrating  tablet Take 1 tablet (4 mg total) by mouth every 4 (four) hours as needed for nausea or vomiting. 03/20/19   Arby Barrette, MD  oxyCODONE-acetaminophen (PERCOCET) 5-325 MG tablet Take 1-2 tablets by mouth every 6 (six) hours as needed. 03/20/19   Arby Barrette, MD  sertraline (ZOLOFT) 100 MG tablet Take 1 tablet (100 mg total) by mouth daily. SCHEDULE WITH NEW PCP 04/22/20   Rodolph Bong, MD  tamsulosin (FLOMAX) 0.4 MG CAPS capsule Take 1 capsule (0.4 mg total) by mouth daily. 03/20/19   Arby Barrette, MD      Allergies    Penicillins    Review of Systems   Review of Systems  Skin:        Abscess  All other systems reviewed and are negative.   Physical Exam Updated Vital Signs BP 118/84 (BP Location: Right Arm)   Pulse 88   Temp 98.5 F (36.9 C) (Oral)   Resp 17   Ht 5\' 10"  (1.778 m)   Wt 124.7 kg   SpO2 100%   BMI 39.46 kg/m  Physical Exam Exam conducted with a chaperone present.  Constitutional:      Appearance: Normal appearance.  HENT:     Head: Normocephalic and atraumatic.     Nose: Nose normal.     Mouth/Throat:     Mouth: Mucous membranes are moist.  Eyes:     Extraocular Movements: Extraocular  movements intact.     Conjunctiva/sclera: Conjunctivae normal.     Pupils: Pupils are equal, round, and reactive to light.  Cardiovascular:     Rate and Rhythm: Normal rate and regular rhythm.     Pulses: Normal pulses.     Heart sounds: Normal heart sounds.  Pulmonary:     Effort: Pulmonary effort is normal.     Breath sounds: Normal breath sounds.  Abdominal:     General: Abdomen is flat. Bowel sounds are normal. There is no distension.     Palpations: Abdomen is soft. There is no mass.     Tenderness: There is no abdominal tenderness. There is no guarding.  Genitourinary:    Penis: Normal.      Testes: Normal.     Comments: Area of induration noted just lateral and inferior to the testicles, active purulent discharge noted on exam, no lymphatic  streaking, no diffuse involvement of the perineum Musculoskeletal:        General: Normal range of motion.     Cervical back: Normal range of motion and neck supple.  Skin:    General: Skin is warm and dry.  Neurological:     General: No focal deficit present.     Mental Status: He is alert and oriented to person, place, and time. Mental status is at baseline.  Psychiatric:        Mood and Affect: Mood normal.        Behavior: Behavior normal.        Thought Content: Thought content normal.        Judgment: Judgment normal.     ED Results / Procedures / Treatments   Labs (all labs ordered are listed, but only abnormal results are displayed) Labs Reviewed  COMPREHENSIVE METABOLIC PANEL - Abnormal; Notable for the following components:      Result Value   Sodium 132 (*)    Potassium 3.4 (*)    Glucose, Bld 189 (*)    Calcium 8.8 (*)    AST 14 (*)    Total Bilirubin 1.4 (*)    All other components within normal limits  CBC WITH DIFFERENTIAL/PLATELET - Abnormal; Notable for the following components:   WBC 12.1 (*)    Neutro Abs 8.8 (*)    All other components within normal limits  LACTIC ACID, PLASMA  LACTIC ACID, PLASMA    EKG None  Radiology No results found.  Procedures .Incision and Drainage  Date/Time: 07/04/2023 6:11 PM  Performed by: Lelon Perla, PA-C Authorized by: Lelon Perla, PA-C   Consent:    Consent obtained:  Verbal   Consent given by:  Patient   Risks, benefits, and alternatives were discussed: yes     Risks discussed:  Bleeding, incomplete drainage, pain and infection   Alternatives discussed:  Alternative treatment Universal protocol:    Procedure explained and questions answered to patient or proxy's satisfaction: yes     Relevant documents present and verified: yes     Test results available : yes     Imaging studies available: yes     Required blood products, implants, devices, and special equipment available: yes      Site/side marked: yes     Immediately prior to procedure, a time out was called: yes     Patient identity confirmed:  Verbally with patient, arm band, hospital-assigned identification number and provided demographic data Location:    Type:  Abscess   Size:  Moderate   Location:  Anogenital   Anogenital location:  Perineum Pre-procedure details:    Skin preparation:  Povidone-iodine Sedation:    Sedation type:  None Anesthesia:    Anesthesia method:  Local infiltration   Local anesthetic:  Lidocaine 1% w/o epi Procedure type:    Complexity:  Simple Procedure details:    Ultrasound guidance: no     Needle aspiration: no     Incision types:  Single straight   Incision depth:  Dermal   Wound management:  Probed and deloculated   Drainage:  Purulent   Drainage amount:  Moderate   Wound treatment:  Wound left open   Packing materials:  1/4 in iodoform gauze Post-procedure details:    Procedure completion:  Tolerated well, no immediate complications     Medications Ordered in ED Medications - No data to display  ED Course/ Medical Decision Making/ A&P                                 Medical Decision Making Patient does remain stable at this time.  CT scan findings were consistent with cellulitis of the scrotum, perineum and groin soft tissues.  There was no signs of soft tissue gas and no indication for Fournier's gangrene at this time.  No loculated fluid collections were noted on CT but bedside incision and drainage was performed by myself which demonstrated a moderate amount of purulent material.  Packing was left in place and wound culture was obtained.  Patient has been started on IV antibiotics, blood cultures obtained as well as lab work which has been interpreted by myself.  Patient does have a mild leukocytosis as well as elevated blood sugar.  Patient has no history of diabetes.  Did touch base with urology, Dr. Imagene Sheller who did agree with plan at this time and  will see the patient in consult.  Did discuss patient case with hospitalist Dr. Allena Katz who has accepted the patient at this point for admission and continued management.  Patient does not actively meet sepsis criteria at this point.  Patient was evaluated by attending physician as well who is in agreement to plan.  Amount and/or Complexity of Data Reviewed Labs: ordered. Radiology: ordered.  Risk Prescription drug management. Decision regarding hospitalization.           Final Clinical Impression(s) / ED Diagnoses Final diagnoses:  None    Rx / DC Orders ED Discharge Orders     None         Lelon Perla, PA-C 07/04/23 1819    Vanetta Mulders, MD 07/04/23 [phone]

## 2023-07-04 NOTE — ED Triage Notes (Signed)
Pt with perineal abscess x 1 wk; was seen at [facility] and sent here for eval; pt sts he opened it at home and got some drainage out

## 2023-07-05 DIAGNOSIS — N492 Inflammatory disorders of scrotum: Secondary | ICD-10-CM | POA: Diagnosis not present

## 2023-07-05 LAB — CBC
HCT: 42.8 % (ref 39.0–52.0)
Hemoglobin: 14.2 g/dL (ref 13.0–17.0)
MCH: 27.7 pg (ref 26.0–34.0)
MCHC: 33.2 g/dL (ref 30.0–36.0)
MCV: 83.4 fL (ref 80.0–100.0)
Platelets: 326 10*3/uL (ref 150–400)
RBC: 5.13 MIL/uL (ref 4.22–5.81)
RDW: 12 % (ref 11.5–15.5)
WBC: 9.9 10*3/uL (ref 4.0–10.5)
nRBC: 0 % (ref 0.0–0.2)

## 2023-07-05 LAB — BASIC METABOLIC PANEL
Anion gap: 9 (ref 5–15)
BUN: 11 mg/dL (ref 6–20)
CO2: 25 mmol/L (ref 22–32)
Calcium: 8.6 mg/dL — ABNORMAL LOW (ref 8.9–10.3)
Chloride: 103 mmol/L (ref 98–111)
Creatinine, Ser: 0.84 mg/dL (ref 0.61–1.24)
GFR, Estimated: >60 mL/min (ref >60)
Glucose, Bld: 238 mg/dL — ABNORMAL HIGH (ref 70–99)
Potassium: 3.6 mmol/L (ref 3.5–5.1)
Sodium: 137 mmol/L (ref 135–145)

## 2023-07-05 LAB — HEMOGLOBIN A1C
Hgb A1c MFr Bld: 8.8 % — ABNORMAL HIGH (ref 4.8–5.6)
Mean Plasma Glucose: 205.86 mg/dL

## 2023-07-05 LAB — HIV ANTIBODY (ROUTINE TESTING W REFLEX): HIV Screen 4th Generation wRfx: NONREACTIVE

## 2023-07-05 MED ORDER — LIVING WELL WITH DIABETES BOOK
Freq: Once | Status: AC
  Administered 2023-07-05: 1
  Filled 2023-07-05: qty 1

## 2023-07-05 MED ORDER — LIDOCAINE HCL (PF) 1 % IJ SOLN
30.0000 mL | Freq: Once | INTRAMUSCULAR | Status: AC
  Administered 2023-07-05: 30 mL
  Filled 2023-07-05: qty 30

## 2023-07-05 MED ORDER — HYDROMORPHONE HCL 1 MG/ML IJ SOLN
1.0000 mg | INTRAMUSCULAR | Status: AC | PRN
  Administered 2023-07-05: 1 mg via INTRAVENOUS
  Filled 2023-07-05: qty 1

## 2023-07-05 NOTE — Inpatient Diabetes Management (Signed)
Inpatient Diabetes Program Recommendations  AACE/ADA: New Consensus Statement on Inpatient Glycemic Control (2015)  Target Ranges:  Prepandial:   less than 140 mg/dL      Peak postprandial:   less than 180 mg/dL (1-2 hours)      Critically ill patients:  140 - 180 mg/dL   Lab Results  Component Value Date   HGBA1C 8.8 (H) 07/05/2023    Review of Glycemic Control  Diabetes history: DM2 Outpatient Diabetes medications: None Current orders for Inpatient glycemic control: None  HgbA1C - 8.8%  Glucose - 238 this am  Inpatient Diabetes Program Recommendations:  Consider adding Novolog 0-9 TID with meals and 0-5 HS  Add CHO mod to heart-healthy diet  Long discussion with pt regarding his diabetes and HgbA1C of 8.8%. Pt adamant he would not take insulin, although agrees with OHAs. Pt states he's lost significant amount of weight in the past year, admits to "bad eating habits" like eating only 1 meal/day (dinner meal). Discussed basic pathophysiology of DM Type 2, basic home care, importance of checking CBGs and maintaining good CBG control to prevent long-term and short-term complications. Reviewed glucose and A1C goals. Reviewed signs and symptoms of hyperglycemia and hypoglycemia along with treatment for both. Discussed impact of nutrition, exercise, stress, sickness, and medications on diabetes control. Ordered Living Well with diabetes booklet and encouraged patient to read through entire book.  Discussed importance of checking CBGs and maintaining good CBG control to prevent long-term and short-term complications. Explained how hyperglycemia leads to damage within blood vessels which lead to the common complications seen with uncontrolled diabetes. Stressed to the patient the importance of improving glycemic control to prevent further complications from uncontrolled diabetes.  Answered all questions.  Will need prescription for glucose meter and supplies.  Follow-up in am.  Thank  you. Ailene Ards, RD, LDN, CDCES Inpatient Diabetes Coordinator [phone]

## 2023-07-05 NOTE — Plan of Care (Signed)

## 2023-07-05 NOTE — Procedures (Signed)
   Urology Procedure Note:  Brandon Kelley is a 38 year old male newly diagnosed diabetic presenting with left scrotal abscess near the inguinal fold.  This was reportedly incised in the emergency department but I could not identify the area.  There was some serous fluid coming from a draining sinus.  However, the shared decision was made to proceed with bedside incision and drainage to further open up the wound.  This will facilitate better drainage allow left finger dissection of any loculations, and adequate packing, of which she had done at the time.  Patient was prepped and draped in the usual sterile fashion.  10cc of 1% lidocaine was injected circumferentially around the wound.  After adequate time for anesthetic effect had been provided, patient stated that he still felt several sharp stabs of pain in the center of the wound.  Another 10cc of lidocaine was administered with excellent anesthetic effect.  After cleaning there were 2 clear draining sinuses with some ragged tissue around the periphery.  I elected to connect these creating a roughly 2cm opening.  This allowed me to probe the area digitally.  There was no tunneling and minimal additional purulent drainage.  The area was then packed adequately with iodoform gauze.  This will be performed every 24 hours.  An ABD pad was applied in the inguinal fold to catch additional drainage and superficial bleeding.  This concluded the procedure.  Blood loss estimated at < 5 cc.  Bedside RN assistant: Delbert Phenix.

## 2023-07-05 NOTE — Progress Notes (Signed)
[facility] PROGRESS NOTE  Brandon Kelley (DOB: 03/07/1986) ZOX:[id number] PCP: None Outpatient Specialists: Rheumatology, Dr. Sharmon Revere  Brief Narrative: Brandon Kelley is a 38 y.o. male with a history of ankylosing spondylitis on etanercept, and hyperglycemia not on medications who presented to the ED on 07/04/2023 with left scrotal abscess which grew more painful after draining himself at home. He was afebrile, tachycardic with leukocytosis (WBC 12.1k). Blood cultures were drawn, antibiotics given, and I&D performed in ED. He was admitted to hospitalist service at [facility] with urology consulted, has undergone repeat I&D with packing 2/3.   Subjective: Pain stable. He's known about high blood sugar for a while but doesn't have time to establish primary care. Ready to start now. Has young children. Spouse at bedside.  Objective: BP 111/77 (BP Location: Right Arm)   Pulse 72   Temp 97.6 F (36.4 C) (Oral)   Resp 18   Ht 5\' 10"  (1.778 m)   Wt 124.7 kg   SpO2 97%   BMI 39.46 kg/m   Gen: No distress Pulm: Clear, nonlabored  CV: RRR, no MRG or edema GI: Soft, NT, ND, +BS  Neuro: Alert and oriented. No new focal deficits. Ext: Warm, no deformities Skin: Scrotum not examined (urology just examined and is about to perform I&D)  Assessment & Plan: Left scrotal cellulitis/abscess: CT without loculated collection or soft tissue gas.  UA negative for UTI.  - Management per urology, bedside I&D with iodoform packing 2/3. Will follow up wound and blood cultures. For now GPCs on gram stain of initial I&D. Will continue vancomycin and meropenem (vague remote history of penicillin allergy, so if this becomes important to suss out, would involve ID to consider amox challenge).   Hyperglycemia, T2DM: HbA1c 8.8%.  - Discussed with patient at length. RD and DM coordinator consulted.  - Will give SSI AC/HS while admitted and will start metformin ~48 hours after contrasted CT 2/2 at ~4:00pm. Given  aversion to medications, will not add further medications at this time.  Ankylosing spondylitis:  - Follow up with rheumatology, Dr. Sharmon Revere, to inform decision on timing to restart etanercept. Have to hold for now.  Obesity: Body mass index is 39.46 kg/m.  - RD consulted as above. If remains with elevated BMI after enacting lifestyle changes, would consider GLP1 agent.   Tyrone Nine, MD [facility] [email or web address] 07/05/2023, 2:32 PM

## 2023-07-06 DIAGNOSIS — N492 Inflammatory disorders of scrotum: Secondary | ICD-10-CM | POA: Diagnosis not present

## 2023-07-06 LAB — GLUCOSE, CAPILLARY
Glucose-Capillary: 140 mg/dL — ABNORMAL HIGH (ref 70–99)
Glucose-Capillary: 153 mg/dL — ABNORMAL HIGH (ref 70–99)

## 2023-07-06 MED ORDER — HYDROCODONE-ACETAMINOPHEN 5-325 MG PO TABS: 1.0000 | ORAL_TABLET | Freq: Four times a day (QID) | ORAL | 0 refills | Status: AC | PRN

## 2023-07-06 MED ORDER — METFORMIN HCL 500 MG PO TABS: 500.0000 mg | ORAL_TABLET | Freq: Two times a day (BID) | ORAL | 0 refills | Status: AC

## 2023-07-06 MED ORDER — INSULIN ASPART 100 UNIT/ML IJ SOLN
0.0000 [IU] | Freq: Three times a day (TID) | INTRAMUSCULAR | Status: DC
  Administered 2023-07-06: 2 [IU] via SUBCUTANEOUS
  Administered 2023-07-06: 1 [IU] via SUBCUTANEOUS

## 2023-07-06 MED ORDER — SULFAMETHOXAZOLE-TRIMETHOPRIM 800-160 MG PO TABS: 1.0000 | ORAL_TABLET | Freq: Two times a day (BID) | ORAL | 0 refills | Status: AC

## 2023-07-06 MED ORDER — SULFAMETHOXAZOLE-TRIMETHOPRIM 800-160 MG PO TABS
1.0000 | ORAL_TABLET | Freq: Two times a day (BID) | ORAL | Status: DC
  Administered 2023-07-06: 1 via ORAL
  Filled 2023-07-06: qty 1

## 2023-07-06 MED ORDER — INSULIN ASPART 100 UNIT/ML IJ SOLN: 0.0000 [IU] | Freq: Every day | INTRAMUSCULAR | Status: DC

## 2023-07-06 NOTE — Plan of Care (Signed)

## 2023-07-06 NOTE — Progress Notes (Signed)
  Subjective: Doing well, pain improving, no N/V/F/C.   Objective: Vital signs in last 24 hours: Temp:  [97.6 F (36.4 C)-98.2 F (36.8 C)] 97.6 F (36.4 C) (02/04 0419) Pulse Rate:  [65-84] 65 (02/04 0419) Resp:  [17-18] 18 (02/04 0419) BP: (106-122)/(70-76) 106/70 (02/04 0419) SpO2:  [96 %-99 %] 96 % (02/04 0419)  Intake/Output from previous day: 02/03 0701 - 02/04 0700 In: 1145.8 [P.O.:220; IV Piggyback:925.8] Out: 1 [Urine:1] Intake/Output this shift: No intake/output data recorded.  Physical Exam:  General: Alert and oriented CV: RRR Lungs: Clear Abdomen: Soft, ND, ATTP;  GU: incision open at base of scrotum, packed with iodoform, no longer draining, skin edges appear healthy, no fluctuance, eschar or necrosis noted   Lab Results: Recent Labs    07/04/23 1604 07/05/23 0403  HGB 15.4 14.2  HCT 44.9 42.8   BMET Recent Labs    07/04/23 1604 07/05/23 0403  NA 132* 137  K 3.4* 3.6  CL 98 103  CO2 25 25  GLUCOSE 189* 238*  BUN 10 11  CREATININE 0.76 0.84  CALCIUM 8.8* 8.6*     Studies/Results: CT PELVIS W CONTRAST Result Date: 07/04/2023 CLINICAL DATA:  Perianal abscess or fistula suspected. Peroneal abscess for 1 week. Spontaneous drainage at home. EXAM: CT PELVIS WITH CONTRAST TECHNIQUE: Multidetector CT imaging of the pelvis was performed using the standard protocol following the bolus administration of intravenous contrast. RADIATION DOSE REDUCTION: This exam was performed according to the departmental dose-optimization program which includes automated exposure control, adjustment of the mA and/or kV according to patient size and/or use of iterative reconstruction technique. CONTRAST:  OMNIPAQUE  IOHEXOL  300 MG/ML  SOLN COMPARISON:  CT renal stone protocol 03/20/2019 FINDINGS: Urinary Tract:  Bladder is normal. Bowel: Visualized small and large bowel are not abnormally distended. No wall thickening or inflammatory changes. Appendix is normal.  Vascular/Lymphatic: No evidence of large vessel aneurysm. No significant lymphadenopathy. Scattered groin lymph nodes are not pathologically enlarged, likely reactive. Reproductive:  Prostate gland is not enlarged. Other: Scrotal skin thickening and edema with infiltration into the left anterior perineal fat and left groin. Changes are likely to represent cellulitis. No soft tissue gas. No loculated collections. Musculoskeletal: No acute bony abnormalities. IMPRESSION: Scrotal skin thickening and edema with infiltration in the left scrotal, peroneal, and groin soft tissues consistent with cellulitis. Reactive lymph nodes in the groin. No loculated collection. No soft tissue gas. Electronically Signed   By: Elsie Gravely M.D.   On: 07/04/2023 17:17    Assessment/Plan: 22 M w/ small left scrotal base abscess s/p drainage in the ER. The area is draining but likely needs to be opened up more to facilitate improved drainage and packing.  Further incision made by urology on 07/06/23  Recommendations: - dressing changes daily with iodoform  - if no Cx result recommend bactrim  for 10 day BID  - patient is ok to be discharged one can do dressing changes independently with wife and can tolerate dressing changes without IV pain meds  - urology will set up wound check in a few months  - urology to follow peripherally     LOS: 2 days   Jackey Pea MD 07/06/2023, 7:18 AM [facility]

## 2023-07-06 NOTE — Inpatient Diabetes Management (Signed)
Inpatient Diabetes Program Recommendations  AACE/ADA: New Consensus Statement on Inpatient Glycemic Control (2015)  Target Ranges:  Prepandial:   less than 140 mg/dL      Peak postprandial:   less than 180 mg/dL (1-2 hours)      Critically ill patients:  140 - 180 mg/dL   Lab Results  Component Value Date   GLUCAP 140 (H) 07/06/2023   HGBA1C 8.8 (H) 07/05/2023    Review of Glycemic Control  Diabetes history: DM2 Outpatient Diabetes medications: None Current orders for Inpatient glycemic control: None  Inpatient Diabetes Program Recommendations:    Spoke with pt at bedside regarding any questions or concerns with checking blood sugars and eating healthy diet. Pt states he has read about 1/2 of the diabetes book and did not have any questions.   Received 1 unit of Novolog for 140 mg/dL blood sugar at lunch.  Discharge Recommendations: Other recommendations: metformin 500 BID, Appt with PCP within week of discharge. Monitor blood sugars 2-3x/day and take logbook to PCP for review Supply/Referral recommendations: Glucometer Test strips Lancet device Lancets   Use Adult Diabetes Insulin Treatment Post Discharge order set.  Will follow.  Thank you. Ailene Ards, RD, LDN, CDCES Inpatient Diabetes Coordinator [phone]

## 2023-07-06 NOTE — Discharge Summary (Signed)
 Physician Discharge Summary   Patient: Brandon Kelley MRN: [id number] DOB: [birth year]  Admit date:     07/04/2023  Discharge date: 07/06/23  Discharge Physician: Bernardino KATHEE Come   PCP: Patient, No Pcp Per   Recommendations at discharge:  Follow up with urology who recommends daily dressing changes with iodoform gauze. Spouse, who is a CNA, has been trained on performing this at home. Continue bactrim . Establish care with PCP for ongoing care of diabetes, started on metformin , intensive education provided while admitted.   Discharge Diagnoses: Principal Problem:   Cellulitis of scrotum Active Problems:   Ankylosing spondylitis [facility])  Hospital Course: Brandon Kelley is a 38 y.o. male with a history of ankylosing spondylitis on etanercept, and hyperglycemia not on medications who presented to the ED on 07/04/2023 with left scrotal abscess which grew more painful after draining himself at home. He was afebrile, tachycardic with leukocytosis (WBC 12.1k). Blood cultures were drawn, antibiotics given, and I&D performed in ED. He was admitted to hospitalist service at [facility] with urology consulted, has undergone repeat I&D with packing 2/3. Urology has cleared patient for discharge on bactrim  with daily packing change at home.  Assessment and Plan: Left scrotal cellulitis/abscess: CT without loculated collection or soft tissue gas.  UA negative for UTI. S/p bedside I&D with iodoform packing 2/3, culture NGTD, gram stain with GPCs. Follow up final wound and blood cultures.     Hyperglycemia, T2DM: HbA1c 8.8%.  - Discussed with patient at length. RD and DM coordinator have as well. - Start metformin  ~48 hours after contrasted CT 2/2 at ~4:00pm. Given aversion to medications, will not add further medications at this time. - Urged to establish with PCP.   Ankylosing spondylitis:  - Follow up with rheumatology, Dr. Ziolkowska, to inform decision on timing to restart etanercept. Have to hold for now.    Obesity: Body mass index is 39.46 kg/m.  - RD consulted as above. If remains with elevated BMI after enacting lifestyle changes, would consider GLP1 agent.   Consultants: Urology Procedures performed: I&D Disposition: Home Diet recommendation:  Carb modified diet DISCHARGE MEDICATION: Allergies as of 07/06/2023       Reactions   Penicillins         Medication List     PAUSE taking these medications    Enbrel SureClick 50 MG/ML injection Wait to take this until your doctor or other care provider tells you to start again. Generic drug: etanercept Inject 50 mg into the skin once a week. On Fridays       TAKE these medications    HYDROcodone -acetaminophen  5-325 MG tablet Commonly known as: NORCO/VICODIN Take 1-2 tablets by mouth every 6 (six) hours as needed for moderate pain (pain score 4-6) or severe pain (pain score 7-10).   ibuprofen  800 MG tablet Commonly known as: ADVIL  Take 1 tablet (800 mg total) by mouth 3 (three) times daily. What changed:  how much to take when to take this reasons to take this   metFORMIN  500 MG tablet Commonly known as: GLUCOPHAGE  Take 1 tablet (500 mg total) by mouth 2 (two) times daily with a meal.   sulfamethoxazole -trimethoprim  800-160 MG tablet Commonly known as: BACTRIM  DS Take 1 tablet by mouth every 12 (twelve) hours for 10 days.   VITAMIN D  PO Take 1 capsule by mouth once a week.               Discharge Care Instructions  (From admission, onward)  Start     Ordered   07/06/23 0000  Discharge wound care:       Comments: Pack scrotal wound daily with iodoform or wet kerlix cover with abd pad   07/06/23 1801            Follow-up Information     [facility]. Schedule an appointment as soon as possible for a visit in 1 week(s).   Contact information: [address] [place] [address] [place]   [address] [phone]               Discharge Exam: Fredricka Weights    07/04/23 1407  Weight: 124.7 kg  No distress GU: incision open at base of scrotum, packed with iodoform, no longer draining, skin edges appear healthy, no fluctuance, eschar or necrosis noted   Condition at discharge: stable  The results of significant diagnostics from this hospitalization (including imaging, microbiology, ancillary and laboratory) are listed below for reference.   Imaging Studies: CT PELVIS W CONTRAST Result Date: 07/04/2023 CLINICAL DATA:  Perianal abscess or fistula suspected. Peroneal abscess for 1 week. Spontaneous drainage at home. EXAM: CT PELVIS WITH CONTRAST TECHNIQUE: Multidetector CT imaging of the pelvis was performed using the standard protocol following the bolus administration of intravenous contrast. RADIATION DOSE REDUCTION: This exam was performed according to the departmental dose-optimization program which includes automated exposure control, adjustment of the mA and/or kV according to patient size and/or use of iterative reconstruction technique. CONTRAST:  OMNIPAQUE  IOHEXOL  300 MG/ML  SOLN COMPARISON:  CT renal stone protocol 03/20/2019 FINDINGS: Urinary Tract:  Bladder is normal. Bowel: Visualized small and large bowel are not abnormally distended. No wall thickening or inflammatory changes. Appendix is normal. Vascular/Lymphatic: No evidence of large vessel aneurysm. No significant lymphadenopathy. Scattered groin lymph nodes are not pathologically enlarged, likely reactive. Reproductive:  Prostate gland is not enlarged. Other: Scrotal skin thickening and edema with infiltration into the left anterior perineal fat and left groin. Changes are likely to represent cellulitis. No soft tissue gas. No loculated collections. Musculoskeletal: No acute bony abnormalities. IMPRESSION: Scrotal skin thickening and edema with infiltration in the left scrotal, peroneal, and groin soft tissues consistent with cellulitis. Reactive lymph nodes in the groin. No loculated  collection. No soft tissue gas. Electronically Signed   By: Elsie Gravely M.D.   On: 07/04/2023 17:17    Microbiology: Results for orders placed or performed during the hospital encounter of 07/04/23  Blood culture (routine x 2)     Status: None (Preliminary result)   Collection Time: 07/04/23  5:45 PM   Specimen: BLOOD  Result Value Ref Range Status   Specimen Description   Final    BLOOD LEFT ANTECUBITAL Performed at [facility], [address] [place], [place] [address]    Special Requests   Final    BOTTLES DRAWN AEROBIC AND ANAEROBIC Blood Culture adequate volume Performed at [facility], [address] [place], [place] [address]    Culture   Final    NO GROWTH 2 DAYS Performed at [facility], [address] [place], [place] [address]    Report Status PENDING  Incomplete  Blood culture (routine x 2)     Status: None (Preliminary result)   Collection Time: 07/04/23  5:45 PM   Specimen: BLOOD  Result Value Ref Range Status   Specimen Description   Final    BLOOD RIGHT ANTECUBITAL Performed at [facility], [address] [place], [place] [address]    Special Requests   Final    BOTTLES DRAWN AEROBIC AND ANAEROBIC Blood Culture adequate volume Performed at [facility], [address] [place], [place] [address]    Culture   Final    NO GROWTH 2 DAYS Performed at [facility], [address], [place], [place] [address]    Report Status PENDING  Incomplete  Aerobic Culture w Gram Stain (superficial specimen)     Status: None (Preliminary result)   Collection Time: 07/04/23  5:56 PM   Specimen: Perineal  Result Value Ref Range Status   Specimen Description   Final    PERINEAL Performed at [facility], [address] [place], [place] [address]    Special Requests   Final    NONE Performed at [facility], [address] [place], [place] [address]    Gram Stain   Final     MODERATE WBC PRESENT,BOTH PMN AND MONONUCLEAR FEW GRAM POSITIVE COCCI IN PAIRS AND CHAINS    Culture   Final    NO GROWTH 2 DAYS Performed at [facility], [address] [place], [place] [address]    Report Status PENDING  Incomplete    Labs: CBC: Recent Labs  Lab 07/04/23 1604 07/05/23 0403  WBC 12.1* 9.9  NEUTROABS 8.8*  --   HGB 15.4 14.2  HCT 44.9 42.8  MCV 81.0 83.4  PLT 336 326   Basic Metabolic Panel: Recent Labs  Lab 07/04/23 1604 07/05/23 0403  NA 132* 137  K 3.4* 3.6  CL 98 103  CO2 25 25  GLUCOSE 189* 238*  BUN 10 11  CREATININE 0.76 0.84  CALCIUM 8.8* 8.6*   Liver Function Tests: Recent Labs  Lab 07/04/23 1604  AST 14*  ALT 14  ALKPHOS 99  BILITOT 1.4*  PROT 8.1  ALBUMIN 3.7   CBG: Recent Labs  Lab 07/06/23 1327 07/06/23 1615  GLUCAP 140* 153*    Discharge time spent: greater than 30 minutes.  Signed: Bernardino KATHEE Come, MD [facility] 07/06/2023

## 2023-07-06 NOTE — Progress Notes (Signed)
AVS reviewed w/ pt & wife- both verbalized an understanding. PIV removed as noted. Pt dressed for d/c to home- Pt to lobby via w/c - home w/ spouse

## 2023-07-06 NOTE — Plan of Care (Signed)
 Nutrition Education Note  RD consulted for nutrition education regarding diabetes.   Lab Results  Component Value Date   HGBA1C 8.8 (H) 07/05/2023    Patient reports a UBW of 275# and denies any changes in weight over the past 1 year. Food recall as below: Breakfast: usually skips; occasionally has a granola bar 9-10am: Pepsi with a honeybun or chips Lunch: skips 12-1: Pepsi or Dr Nunzio Dinner: homemade meal (often chicken and dumplings or tacos)  RD provided Carbohydrate Counting for People with Diabetes and Using Nutrition Labels: Carbohydrate handouts from the Academy of [employer] and Dietetics. Discussed different food groups and their effects on blood sugar, emphasizing carbohydrate-containing foods. Provided list of carbohydrates and recommended serving sizes of common foods.  Discussed importance of controlled and consistent carbohydrate intake throughout the day. Provided examples of ways to balance meals/snacks and encouraged intake of high-fiber, whole grain complex carbohydrates. Recommend patient avoid regular sodas and try to switch to diet. Teach back method used.  Expect fair compliance.  Body mass index is 39.46 kg/m. Pt meets criteria for Obesity II based on current BMI.  Diet order was Heart Healthy this morning. Changed patient to a Carb modified diet. No meals intakes documented. Labs and medications reviewed. No further nutrition interventions warranted at this time. RD contact information provided. If additional nutrition issues arise, please re-consult RD.  Trude Ned RD, LDN Contact via [employer].

## 2023-07-08 LAB — AEROBIC CULTURE W GRAM STAIN (SUPERFICIAL SPECIMEN): Culture: NO GROWTH

## 2023-07-09 LAB — CULTURE, BLOOD (ROUTINE X 2)
Culture: NO GROWTH
Culture: NO GROWTH
Special Requests: ADEQUATE
Special Requests: ADEQUATE
